# Patient Record
Sex: Female | Born: 1965 | Race: White | Hispanic: No | Marital: Married | Smoking: Never smoker
Health system: Southern US, Community
[De-identification: ages and names within clinical notes are randomized; demographics above are authoritative.]

## PROBLEM LIST (undated history)

## (undated) DIAGNOSIS — N289 Disorder of kidney and ureter, unspecified: Secondary | ICD-10-CM

## (undated) DIAGNOSIS — R112 Nausea with vomiting, unspecified: Secondary | ICD-10-CM

## (undated) DIAGNOSIS — K5790 Diverticulosis of intestine, part unspecified, without perforation or abscess without bleeding: Secondary | ICD-10-CM

## (undated) DIAGNOSIS — C73 Malignant neoplasm of thyroid gland: Secondary | ICD-10-CM

## (undated) DIAGNOSIS — R7303 Prediabetes: Secondary | ICD-10-CM

## (undated) DIAGNOSIS — Z9889 Other specified postprocedural states: Secondary | ICD-10-CM

## (undated) DIAGNOSIS — IMO0002 Reserved for concepts with insufficient information to code with codable children: Secondary | ICD-10-CM

## (undated) HISTORY — PX: LAMINECTOMY: SHX219

## (undated) HISTORY — PX: KNEE ARTHROSCOPY: SUR90

---

## 2001-08-09 ENCOUNTER — Other Ambulatory Visit: Admission: RE | Admit: 2001-08-09 | Discharge: 2001-08-09 | Payer: Self-pay | Admitting: Family Medicine

## 2007-06-07 ENCOUNTER — Encounter: Admission: RE | Admit: 2007-06-07 | Discharge: 2007-06-07 | Payer: Self-pay | Admitting: Family Medicine

## 2009-06-26 ENCOUNTER — Encounter: Admission: RE | Admit: 2009-06-26 | Discharge: 2009-06-26 | Payer: Self-pay | Admitting: Family Medicine

## 2010-09-29 ENCOUNTER — Encounter: Payer: Self-pay | Admitting: Family Medicine

## 2011-08-23 ENCOUNTER — Emergency Department (INDEPENDENT_AMBULATORY_CARE_PROVIDER_SITE_OTHER)
Admission: EM | Admit: 2011-08-23 | Discharge: 2011-08-23 | Disposition: A | Discharge status: Home / Self Care | Payer: BC Managed Care – PPO | Source: Home / Self Care | Attending: Family Medicine | Admitting: Family Medicine

## 2011-08-23 ENCOUNTER — Emergency Department (INDEPENDENT_AMBULATORY_CARE_PROVIDER_SITE_OTHER): Payer: BC Managed Care – PPO

## 2011-08-23 DIAGNOSIS — Z8719 Personal history of other diseases of the digestive system: Secondary | ICD-10-CM

## 2011-08-23 DIAGNOSIS — R1032 Left lower quadrant pain: Secondary | ICD-10-CM

## 2011-08-23 DIAGNOSIS — K5732 Diverticulitis of large intestine without perforation or abscess without bleeding: Secondary | ICD-10-CM

## 2011-08-23 HISTORY — DX: Disorder of kidney and ureter, unspecified: N28.9

## 2011-08-23 HISTORY — DX: Reserved for concepts with insufficient information to code with codable children: IMO0002

## 2011-08-23 LAB — POCT URINALYSIS DIP (DEVICE)
Bilirubin Urine: NEGATIVE
Hgb urine dipstick: NEGATIVE
Ketones, ur: NEGATIVE mg/dL
Leukocytes, UA: NEGATIVE
Protein, ur: NEGATIVE mg/dL
Specific Gravity, Urine: 1.02 (ref 1.005–1.030)
Urobilinogen, UA: 1 mg/dL (ref 0.0–1.0)
pH: 7 (ref 5.0–8.0)

## 2011-08-23 LAB — DIFFERENTIAL
Basophils Absolute: 0 10*3/uL (ref 0.0–0.1)
Basophils Relative: 0 % (ref 0–1)
Lymphocytes Relative: 13 % (ref 12–46)
Monocytes Relative: 10 % (ref 3–12)
Neutro Abs: 9.2 10*3/uL — ABNORMAL HIGH (ref 1.7–7.7)
Neutrophils Relative %: 76 % (ref 43–77)

## 2011-08-23 LAB — CBC
MCV: 87.1 fL (ref 78.0–100.0)
Platelets: 230 10*3/uL (ref 150–400)
RBC: 4.42 MIL/uL (ref 3.87–5.11)

## 2011-08-23 LAB — OCCULT BLOOD, POC DEVICE: Fecal Occult Bld: NEGATIVE

## 2011-08-23 LAB — BASIC METABOLIC PANEL
CO2: 24 mEq/L (ref 19–32)
Creatinine, Ser: 0.69 mg/dL (ref 0.50–1.10)
Potassium: 3.6 mEq/L (ref 3.5–5.1)

## 2011-08-23 MED ORDER — TRAMADOL HCL 50 MG PO TABS: 50.0000 mg | ORAL_TABLET | Freq: Four times a day (QID) | ORAL | Status: AC | PRN

## 2011-08-23 MED ORDER — AMOXICILLIN-POT CLAVULANATE 875-125 MG PO TABS: 1.0000 | ORAL_TABLET | Freq: Two times a day (BID) | ORAL | Status: AC

## 2011-08-23 MED ORDER — METRONIDAZOLE 500 MG PO TABS: 500.0000 mg | ORAL_TABLET | Freq: Two times a day (BID) | ORAL | Status: AC

## 2011-08-23 MED ORDER — OMEPRAZOLE-SODIUM BICARBONATE 40-1100 MG PO CAPS: 1.0000 | ORAL_CAPSULE | Freq: Every day | ORAL | Status: AC

## 2011-08-23 NOTE — ED Notes (Signed)
Pt states she has LLQ pain since Monday.  Pain is severe, low grade fever and denies diarrhea and vomiting

## 2011-08-23 NOTE — ED Provider Notes (Signed)
History    Patient reports having abdominal pain started on Monday. She reports feeling like she can't evacuate her bowels completely. She reports pain left lower quadrant some nausea and some increased GERD but no diarrhea. While the pain has been in the left lower quadrant she denies any vaginal discharge of significance and states other than a slightly increased mucus no problems with dyspareunia. She's never had diverticulitis but this does research and fit and is curious whether her symptoms indicate that she had diverticulitis in the. She reports having some fever and myalgia on Wednesday but this resolved. And did not feel like the flu. CSN: [id number] Arrival date & time: 08/23/2011  2:10 PM   First MD Initiated Contact with Patient 08/23/11 1259      Chief Complaint  Patient presents with  . Abdominal Pain    (Consider location/radiation/quality/duration/timing/severity/associated sxs/prior treatment) HPI  Past Medical History  Diagnosis Date  . Non-functioning kidney   . Degenerative disk disease     Past Surgical History  Procedure Date  . Knee arthroscopy   . Cesarean section     History reviewed. No pertinent family history.  History  Substance Use Topics  . Smoking status: Never Smoker   . Smokeless tobacco: Not on file  . Alcohol Use: Yes     occ    OB History    Grav Para Term Preterm Abortions TAB SAB Ect Mult Living                  Review of Systems  Constitutional: Positive for fever. Negative for appetite change.  Gastrointestinal: Positive for abdominal pain, constipation and abdominal distention. Negative for nausea, vomiting, blood in stool and rectal pain.  Genitourinary: Negative for urgency, vaginal discharge, difficulty urinating and dyspareunia.    Allergies  Review of patient's allergies indicates no known allergies.  Home Medications  No current outpatient prescriptions on file.  BP 126/86  Pulse 81  Temp(Src) 99.8 F (37.7 C)  (Oral)  Resp 16  SpO2 100%  LMP 08/02/2011  Physical Exam  Constitutional: She appears well-developed.  HENT:  Head: Normocephalic.  Neck: Normal range of motion.  Cardiovascular: Normal rate, regular rhythm and normal heart sounds.   Pulmonary/Chest: Effort normal and breath sounds normal. No respiratory distress. She has no wheezes.  Abdominal: Soft. There is no hepatosplenomegaly. There is tenderness in the left lower quadrant. There is no rebound, no guarding and no CVA tenderness.    Genitourinary: Rectal exam shows tenderness. Rectal exam shows no internal hemorrhoid, no fissure and anal tone normal. Guaiac negative stool.   some tenderness noted on rectal exam in the left lower quadrant area  ED Course  Procedures (including critical care time)  Labs Reviewed  BASIC METABOLIC PANEL - Abnormal; Notable for the following:    Calcium 12.4 (*)    All other components within normal limits  CBC - Abnormal; Notable for the following:    WBC 12.1 (*)    All other components within normal limits  DIFFERENTIAL - Abnormal; Notable for the following:    Neutro Abs 9.2 (*)    Monocytes Absolute 1.1 (*)    All other components within normal limits  POCT URINALYSIS DIP (DEVICE)  LIPASE, BLOOD  POCT URINALYSIS DIPSTICK  POCT OCCULT BLOOD STOOL, DEVICE  GC/CHLAMYDIA PROBE AMP, URINE   Dg Abd 2 Views  08/23/2011  *RADIOLOGY REPORT*  Clinical Data: Left lower quadrant abdominal pain.  ABDOMEN - 2 VIEW  Comparison: None  Findings: The lung bases are clear.  There is scattered air and stool in the colon.  Air filled loops of small bowel are noted without significant distention or air fluid levels.  Findings could be secondary to a mild, developing ileus or gastroenteritis.  No findings for obstruction or perforation.  The soft tissue shadows are maintained.  The bony structures are intact.  IMPRESSION: Probable mild ileus or gastroenteritis.  No findings for obstruction or perforation.   Original Report Authenticated By: P. Loralie Champagne, M.D.     No diagnosis found.    MDM          Hassan Rowan, MD 08/23/11 2128

## 2011-08-25 LAB — GC/CHLAMYDIA PROBE AMP, URINE: GC Probe Amp, Urine: NEGATIVE

## 2012-05-25 ENCOUNTER — Emergency Department (INDEPENDENT_AMBULATORY_CARE_PROVIDER_SITE_OTHER): Payer: BC Managed Care – PPO

## 2012-05-25 ENCOUNTER — Encounter (HOSPITAL_COMMUNITY): Payer: Self-pay | Admitting: *Deleted

## 2012-05-25 ENCOUNTER — Emergency Department (HOSPITAL_COMMUNITY)
Admission: EM | Admit: 2012-05-25 | Discharge: 2012-05-25 | Disposition: A | Discharge status: Home / Self Care | Payer: BC Managed Care – PPO | Source: Home / Self Care | Attending: Emergency Medicine | Admitting: Emergency Medicine

## 2012-05-25 DIAGNOSIS — K5732 Diverticulitis of large intestine without perforation or abscess without bleeding: Secondary | ICD-10-CM

## 2012-05-25 DIAGNOSIS — K5792 Diverticulitis of intestine, part unspecified, without perforation or abscess without bleeding: Secondary | ICD-10-CM

## 2012-05-25 HISTORY — DX: Diverticulosis of intestine, part unspecified, without perforation or abscess without bleeding: K57.90

## 2012-05-25 LAB — WET PREP, GENITAL: Yeast Wet Prep HPF POC: NONE SEEN

## 2012-05-25 LAB — POCT URINALYSIS DIP (DEVICE)
Bilirubin Urine: NEGATIVE
Glucose, UA: 100 mg/dL — AB
Hgb urine dipstick: NEGATIVE
Leukocytes, UA: NEGATIVE
Nitrite: NEGATIVE
Protein, ur: NEGATIVE mg/dL
Urobilinogen, UA: 2 mg/dL — ABNORMAL HIGH (ref 0.0–1.0)

## 2012-05-25 LAB — POCT I-STAT, CHEM 8
BUN: 9 mg/dL (ref 6–23)
HCT: 42 % (ref 36.0–46.0)
Hemoglobin: 14.3 g/dL (ref 12.0–15.0)
Sodium: 139 mEq/L (ref 135–145)
TCO2: 26 mmol/L (ref 0–100)

## 2012-05-25 LAB — OCCULT BLOOD, POC DEVICE: Fecal Occult Bld: NEGATIVE

## 2012-05-25 MED ORDER — METRONIDAZOLE 500 MG PO TABS: 500.0000 mg | ORAL_TABLET | Freq: Three times a day (TID) | ORAL | Status: DC

## 2012-05-25 MED ORDER — CIPROFLOXACIN HCL 500 MG PO TABS: 500.0000 mg | ORAL_TABLET | Freq: Two times a day (BID) | ORAL | Status: DC

## 2012-05-25 MED ORDER — IBUPROFEN 800 MG PO TABS: 800.0000 mg | ORAL_TABLET | Freq: Three times a day (TID) | ORAL | Status: DC | PRN

## 2012-05-25 NOTE — ED Provider Notes (Signed)
History     CSN: [id number]  Arrival date & time 05/25/12  1036   First MD Initiated Contact with Patient 05/25/12 1046      Chief Complaint  Patient presents with  . Abdominal Pain    (Consider location/radiation/quality/duration/timing/severity/associated sxs/prior treatment) HPI Comments: Patient reports acute onset of constant, waxing and waning nonradiating left lower quadrant pain starting last night. Reports that the pain feels like it is "twisting". It is worse with getting up, walking around. No alleviating factors. Tried some Aleve without relief. It is not related to eating, fasting, defecation, urination. Had normal bowel movement this morning, no change in pain. Had sexual intercourse with husband of 22 years on Monday, reports no pain with this. No vaginal bleeding or discharge.  no hematuria, urgency, frequency, cloudy or oderous urine.  Reports feeling feverish last night, but no documented temperature at home took some Aleve last night with no improvement her symptoms. Has not had any antipyretics the past 8 hour. States that her symptoms feel similar to previous episode of diverticulitis for which she was evaluated in the urgent care 10 months ago. No history of gonorrhea, Chlamydia, PID, herpes, HIV, syphilis. No history of ovarian cysts, nephrolithiasis. Patient states that she has a "nonfunctioning kidney".   ROS as noted in HPI. All other ROS negative.   Patient is a 46 y.o. female presenting with abdominal pain. The history is provided by the patient. No language interpreter was used.  Abdominal Pain The primary symptoms of the illness include abdominal pain. The current episode started yesterday. The onset of the illness was sudden. The problem has not changed since onset. The abdominal pain is located in the LLQ. The abdominal pain does not radiate. The abdominal pain is relieved by nothing. The abdominal pain is exacerbated by movement.  The patient states that she  believes she is currently not pregnant. The patient has not had a change in bowel habit. Symptoms associated with the illness do not include chills, anorexia, diaphoresis, heartburn, constipation, urgency, hematuria, frequency or back pain. Significant associated medical issues include diverticulitis.    Past Medical History  Diagnosis Date  . Non-functioning kidney   . Diverticulosis   . Degenerative disk disease     C4-C7    Past Surgical History  Procedure Date  . Knee arthroscopy   . Cesarean section     Family History  Problem Relation Age of Onset  . Hypertension Mother   . Heart failure Father   . Cancer Father     History  Substance Use Topics  . Smoking status: Never Smoker   . Smokeless tobacco: Not on file  . Alcohol Use: Yes     occ    OB History    Grav Para Term Preterm Abortions TAB SAB Ect Mult Living                  Review of Systems  Constitutional: Negative for chills and diaphoresis.  Gastrointestinal: Positive for abdominal pain. Negative for heartburn, constipation and anorexia.  Genitourinary: Negative for urgency, frequency and hematuria.  Musculoskeletal: Negative for back pain.    Allergies  Review of patient's allergies indicates no known allergies.  Home Medications   Current Outpatient Rx  Name Route Sig Dispense Refill  . CIPROFLOXACIN HCL 500 MG PO TABS Oral Take 1 tablet (500 mg total) by mouth 2 (two) times daily. X  10 days 20 tablet 0  . IBUPROFEN 800 MG PO TABS Oral Take 1  tablet (800 mg total) by mouth every 8 (eight) hours as needed for pain. 20 tablet 0  . METRONIDAZOLE 500 MG PO TABS Oral Take 1 tablet (500 mg total) by mouth 3 (three) times daily. X 10 days 30 tablet 0    BP 118/68  Pulse 68  Temp 98 F (36.7 C) (Oral)  Resp 18  SpO2 99%  LMP 05/09/2012  Physical Exam  Nursing note and vitals reviewed. Constitutional: She is oriented to person, place, and time. She appears well-developed and well-nourished.    HENT:  Head: Normocephalic and atraumatic.  Eyes: Conjunctivae normal and EOM are normal.  Neck: Normal range of motion.  Cardiovascular: Normal rate, regular rhythm, normal heart sounds and intact distal pulses.   No murmur heard. Pulmonary/Chest: Effort normal and breath sounds normal.  Abdominal: Soft. Normal appearance and bowel sounds are normal. She exhibits no distension. There is tenderness in the left lower quadrant. There is no rebound, no guarding and no CVA tenderness.  Genitourinary: Rectum normal, vagina normal and uterus normal. Rectal exam shows no fissure, no mass, no tenderness and anal tone normal. Guaiac negative stool. Pelvic exam was performed with patient supine. Cervix exhibits no motion tenderness and no discharge. Right adnexum displays no mass, no tenderness and no fullness. Left adnexum displays no mass, no tenderness and no fullness.       Soft stool rectal vault  Musculoskeletal: Normal range of motion.  Neurological: She is alert and oriented to person, place, and time.  Skin: Skin is warm and dry.  Psychiatric: She has a normal mood and affect. Her behavior is normal. Judgment and thought content normal.    ED Course  Procedures (including critical care time)  Labs Reviewed  POCT URINALYSIS DIP (DEVICE) - Abnormal; Notable for the following:    Glucose, UA 100 (*)     Urobilinogen, UA 2.0 (*)     All other components within normal limits  POCT I-STAT, CHEM 8 - Abnormal; Notable for the following:    Glucose, Bld 108 (*)     Calcium, Ion 1.65 (*)     All other components within normal limits  POCT PREGNANCY, URINE  OCCULT BLOOD, POC DEVICE  OCCULT BLOOD X 1 CARD TO LAB, STOOL  GC/CHLAMYDIA PROBE AMP, GENITAL  WET PREP, GENITAL     1. Diverticulitis   2. Serum calcium elevated     Results for orders placed during the hospital encounter of 05/25/12  POCT URINALYSIS DIP (DEVICE)      Component Value Range   Glucose, UA 100 (*) NEGATIVE mg/dL    Bilirubin Urine NEGATIVE  NEGATIVE   Ketones, ur NEGATIVE  NEGATIVE mg/dL   Specific Gravity, Urine 1.020  1.005 - 1.030   Hgb urine dipstick NEGATIVE  NEGATIVE   pH 6.5  5.0 - 8.0   Protein, ur NEGATIVE  NEGATIVE mg/dL   Urobilinogen, UA 2.0 (*) 0.0 - 1.0 mg/dL   Nitrite NEGATIVE  NEGATIVE   Leukocytes, UA NEGATIVE  NEGATIVE  POCT PREGNANCY, URINE      Component Value Range   Preg Test, Ur NEGATIVE  NEGATIVE  OCCULT BLOOD, POC DEVICE      Component Value Range   Fecal Occult Bld NEGATIVE    POCT I-STAT, CHEM 8      Component Value Range   Sodium 139  135 - 145 mEq/L   Potassium 3.5  3.5 - 5.1 mEq/L   Chloride 105  96 - 112 mEq/L   BUN  9  6 - 23 mg/dL   Creatinine, Ser 1.61  0.50 - 1.10 mg/dL   Glucose, Bld 096 (*) 70 - 99 mg/dL   Calcium, Ion 0.45 (*) 1.12 - 1.23 mmol/L   TCO2 26  0 - 100 mmol/L   Hemoglobin 14.3  12.0 - 15.0 g/dL   HCT 40.9  81.1 - 91.4 %   Comment NOTIFIED PHYSICIAN     Dg Abd Acute W/chest  05/25/2012  *RADIOLOGY REPORT*  Clinical Data: Left lower quadrant pain.  ACUTE ABDOMEN SERIES (ABDOMEN 2 VIEW & CHEST 1 VIEW)  Comparison: 08/23/2011  Findings: Lungs are clear.  No free intraperitoneal air. Small hiatal hernia.  The bowel gas pattern is non-obstructive. Organ outlines are normal where seen. No acute or aggressive osseous abnormality identified.  IMPRESSION: Small hiatal hernia.  Nonobstructive bowel gas pattern.   Original Report Authenticated By: Waneta Martins, M.D.    MDM  Previous records reviewed. Patient seen for similar encounter December 2012, slightly elevated white count, calcium level at 1.23, 2 view abdomen showed mild ileus versus gastroenteritis. Otherwise workup was normal.   X-ray reviewed. No perforation, obstruction. Full report per radiology. Mild hypercalcemia on istat  Pt abd exam is benign, no peritoneal signs. Afebrile, vitals normal. No evidence of surgical abd. Doubt SBO, mesenteric ischemia, appendicitis, hepatitis,  cholecystitis, pancreatitis, or perforated viscus. No evidence to support or suggest GYN pathology such as ovarian torsion or infection. Will send home with ibuprofen, Cipro and Flagyl for outpatient treatment of diverticulitis. Discussed labs, MDM and plan with patient. She will followup with her primary care physician for workup of hypercalcemia. Gave patient strict  abdominal pain return precautions. Advised her to return here in 24 hours repeat abdominal exam. Patient agrees with plan.     Luiz Blare, MD 05/25/12 1351

## 2012-05-25 NOTE — ED Notes (Signed)
Pt reports left side abdomen pain that started yesterday - DENIES  N/v/d, urinary frequency or pain. Pt states "It feels like when I had diverticulitis 10 months ago - a twisting pain."

## 2012-05-26 LAB — GC/CHLAMYDIA PROBE AMP, GENITAL: Chlamydia, DNA Probe: NEGATIVE

## 2012-07-27 ENCOUNTER — Ambulatory Visit: Payer: Self-pay | Admitting: Otolaryngology

## 2012-08-25 ENCOUNTER — Ambulatory Visit: Payer: Self-pay | Admitting: Otolaryngology

## 2012-08-26 LAB — CALCIUM: Calcium, Total: 8.7 mg/dL (ref 8.5–10.1)

## 2012-10-20 ENCOUNTER — Ambulatory Visit: Payer: Self-pay | Admitting: Otolaryngology

## 2012-10-20 HISTORY — PX: TOTAL THYROIDECTOMY: SHX2547

## 2012-10-20 LAB — CALCIUM: Calcium, Total: 7.9 mg/dL — ABNORMAL LOW (ref 8.5–10.1)

## 2012-10-21 LAB — ALBUMIN: Albumin: 3.4 g/dL (ref 3.4–5.0)

## 2012-10-21 LAB — CALCIUM: Calcium, Total: 7.4 mg/dL — ABNORMAL LOW (ref 8.5–10.1)

## 2012-10-25 LAB — PATHOLOGY REPORT

## 2013-01-28 ENCOUNTER — Emergency Department (HOSPITAL_COMMUNITY)
Admission: EM | Admit: 2013-01-28 | Discharge: 2013-01-29 | Disposition: A | Discharge status: Home / Self Care | Payer: BC Managed Care – PPO | Attending: Emergency Medicine | Admitting: Emergency Medicine

## 2013-01-28 ENCOUNTER — Encounter (HOSPITAL_COMMUNITY): Payer: Self-pay | Admitting: *Deleted

## 2013-01-28 ENCOUNTER — Emergency Department (HOSPITAL_COMMUNITY): Payer: BC Managed Care – PPO

## 2013-01-28 DIAGNOSIS — K5732 Diverticulitis of large intestine without perforation or abscess without bleeding: Secondary | ICD-10-CM | POA: Insufficient documentation

## 2013-01-28 DIAGNOSIS — Z3202 Encounter for pregnancy test, result negative: Secondary | ICD-10-CM | POA: Insufficient documentation

## 2013-01-28 DIAGNOSIS — Z8739 Personal history of other diseases of the musculoskeletal system and connective tissue: Secondary | ICD-10-CM | POA: Insufficient documentation

## 2013-01-28 DIAGNOSIS — K5792 Diverticulitis of intestine, part unspecified, without perforation or abscess without bleeding: Secondary | ICD-10-CM

## 2013-01-28 DIAGNOSIS — Z87448 Personal history of other diseases of urinary system: Secondary | ICD-10-CM | POA: Insufficient documentation

## 2013-01-28 DIAGNOSIS — R509 Fever, unspecified: Secondary | ICD-10-CM | POA: Insufficient documentation

## 2013-01-28 DIAGNOSIS — Z85850 Personal history of malignant neoplasm of thyroid: Secondary | ICD-10-CM | POA: Insufficient documentation

## 2013-01-28 DIAGNOSIS — Z9889 Other specified postprocedural states: Secondary | ICD-10-CM | POA: Insufficient documentation

## 2013-01-28 DIAGNOSIS — Z79899 Other long term (current) drug therapy: Secondary | ICD-10-CM | POA: Insufficient documentation

## 2013-01-28 HISTORY — DX: Malignant neoplasm of thyroid gland: C73

## 2013-01-28 LAB — CBC WITH DIFFERENTIAL/PLATELET
Basophils Absolute: 0 10*3/uL (ref 0.0–0.1)
Basophils Relative: 0 % (ref 0–1)
Eosinophils Absolute: 0.2 10*3/uL (ref 0.0–0.7)
Eosinophils Relative: 2 % (ref 0–5)
HCT: 39 % (ref 36.0–46.0)
Hemoglobin: 13.3 g/dL (ref 12.0–15.0)
Lymphocytes Relative: 9 % — ABNORMAL LOW (ref 12–46)
Lymphs Abs: 1.3 10*3/uL (ref 0.7–4.0)
MCH: 29.4 pg (ref 26.0–34.0)
MCHC: 34.1 g/dL (ref 30.0–36.0)
MCV: 86.3 fL (ref 78.0–100.0)
Monocytes Absolute: 1.2 10*3/uL — ABNORMAL HIGH (ref 0.1–1.0)
Monocytes Relative: 9 % (ref 3–12)
Neutro Abs: 11 10*3/uL — ABNORMAL HIGH (ref 1.7–7.7)
Neutrophils Relative %: 80 % — ABNORMAL HIGH (ref 43–77)
Platelets: 257 10*3/uL (ref 150–400)
RBC: 4.52 MIL/uL (ref 3.87–5.11)
RDW: 12.2 % (ref 11.5–15.5)
WBC: 13.7 10*3/uL — ABNORMAL HIGH (ref 4.0–10.5)

## 2013-01-28 LAB — COMPREHENSIVE METABOLIC PANEL
ALT: 30 U/L (ref 0–35)
AST: 38 U/L — ABNORMAL HIGH (ref 0–37)
Albumin: 4.1 g/dL (ref 3.5–5.2)
Alkaline Phosphatase: 69 U/L (ref 39–117)
BUN: 11 mg/dL (ref 6–23)
CO2: 25 mEq/L (ref 19–32)
Calcium: 9.4 mg/dL (ref 8.4–10.5)
Chloride: 102 mEq/L (ref 96–112)
Creatinine, Ser: 0.76 mg/dL (ref 0.50–1.10)
GFR calc Af Amer: >90 mL/min (ref >90)
GFR calc non Af Amer: >90 mL/min (ref >90)
Glucose, Bld: 106 mg/dL — ABNORMAL HIGH (ref 70–99)
Potassium: 3.8 mEq/L (ref 3.5–5.1)
Sodium: 138 mEq/L (ref 135–145)
Total Bilirubin: 0.3 mg/dL (ref 0.3–1.2)
Total Protein: 7.6 g/dL (ref 6.0–8.3)

## 2013-01-28 LAB — URINALYSIS, ROUTINE W REFLEX MICROSCOPIC
Bilirubin Urine: NEGATIVE
Glucose, UA: NEGATIVE mg/dL
Hgb urine dipstick: NEGATIVE
Ketones, ur: NEGATIVE mg/dL
Leukocytes, UA: NEGATIVE
Nitrite: NEGATIVE
Protein, ur: NEGATIVE mg/dL
Specific Gravity, Urine: 1.012 (ref 1.005–1.030)
Urobilinogen, UA: 1 mg/dL (ref 0.0–1.0)
pH: 6.5 (ref 5.0–8.0)

## 2013-01-28 LAB — GLUCOSE, CAPILLARY: Glucose-Capillary: 109 mg/dL — ABNORMAL HIGH (ref 70–99)

## 2013-01-28 LAB — PREGNANCY, URINE: Preg Test, Ur: NEGATIVE

## 2013-01-28 MED ORDER — METRONIDAZOLE IN NACL 5-0.79 MG/ML-% IV SOLN
500.0000 mg | Freq: Once | INTRAVENOUS | Status: AC
  Administered 2013-01-29: 500 mg via INTRAVENOUS
  Filled 2013-01-28: qty 100

## 2013-01-28 MED ORDER — IOHEXOL 300 MG/ML  SOLN
100.0000 mL | Freq: Once | INTRAMUSCULAR | Status: AC | PRN
  Administered 2013-01-28: 100 mL via INTRAVENOUS

## 2013-01-28 MED ORDER — MORPHINE SULFATE 4 MG/ML IJ SOLN: 6.0000 mg | Freq: Once | INTRAMUSCULAR | Status: DC

## 2013-01-28 MED ORDER — CIPROFLOXACIN IN D5W 400 MG/200ML IV SOLN
400.0000 mg | Freq: Once | INTRAVENOUS | Status: AC
  Administered 2013-01-29: 400 mg via INTRAVENOUS
  Filled 2013-01-28: qty 200

## 2013-01-28 MED ORDER — IOHEXOL 300 MG/ML  SOLN
50.0000 mL | Freq: Once | INTRAMUSCULAR | Status: AC | PRN
  Administered 2013-01-28: 50 mL via ORAL

## 2013-01-28 MED ORDER — SODIUM CHLORIDE 0.9 % IV BOLUS (SEPSIS)
1000.0000 mL | Freq: Once | INTRAVENOUS | Status: AC
  Administered 2013-01-28: 1000 mL via INTRAVENOUS

## 2013-01-28 MED ORDER — ONDANSETRON HCL 4 MG/2ML IJ SOLN: 4.0000 mg | Freq: Once | INTRAMUSCULAR | Status: DC

## 2013-01-28 NOTE — ED Provider Notes (Signed)
History     CSN: [id number]  Arrival date & time 01/28/13  2045   First MD Initiated Contact with Patient 01/28/13 2111      Chief Complaint  Patient presents with  . Abdominal Pain  . Fever    (Consider location/radiation/quality/duration/timing/severity/associated sxs/prior treatment) HPI Patient presents to the emergency department with left lower quadrant abdominal pain, that began earlier today.  Patient, states she was seen at an urgent care and was referred here for further evaluation of her pain.  Patient denies nausea, vomiting, diarrhea, blood in her stool, vaginal bleeding, vaginal discharge, chest pain, shortness of breath, fever, dizziness, syncope, rash or weakness.  Patient, states, that she has a history of diverticulitis, but no CT scan in the past.  Patient, states, that she was placed on antibiotics.  Back in December for suspected diverticulitis.  Patient, states she did not take any medications prior to arrival.  Patient, states palpation makes her pain, worse Past Medical History  Diagnosis Date  . Non-functioning kidney   . Diverticulosis   . Degenerative disk disease     C4-C7  . Thyroid cancer     February 2014    Past Surgical History  Procedure Laterality Date  . Knee arthroscopy    . Cesarean section      Family History  Problem Relation Age of Onset  . Hypertension Mother   . Heart failure Father   . Cancer Father     History  Substance Use Topics  . Smoking status: Never Smoker   . Smokeless tobacco: Not on file  . Alcohol Use: Yes     Comment: occ    OB History   Grav Para Term Preterm Abortions TAB SAB Ect Mult Living                  Review of Systems All other systems negative except as documented in the HPI. All pertinent positives and negatives as reviewed in the HPI. Allergies  Review of patient's allergies indicates no known allergies.  Home Medications   Current Outpatient Rx  Name  Route  Sig  Dispense  Refill  .  levothyroxine (SYNTHROID, LEVOTHROID) 137 MCG tablet   Oral   Take 137 mcg by mouth daily before breakfast.         . naproxen sodium (ALEVE) 220 MG tablet   Oral   Take 220 mg by mouth 2 (two) times daily with a meal. pain           BP 126/73  Pulse 98  Temp(Src) 99.8 F (37.7 C) (Oral)  Resp 16  Ht 5\' 3"  (1.6 m)  Wt 149 lb (67.586 kg)  BMI 26.4 kg/m2  SpO2 100%  LMP 12/30/2012  Physical Exam  Nursing note and vitals reviewed. Constitutional: She is oriented to person, place, and time. She appears well-developed and well-nourished. No distress.  HENT:  Head: Normocephalic and atraumatic.  Mouth/Throat: Oropharynx is clear and moist.  Eyes: Pupils are equal, round, and reactive to light.  Neck: Normal range of motion. Neck supple.  Cardiovascular: Normal rate, regular rhythm and normal heart sounds.  Exam reveals no gallop and no friction rub.   No murmur heard. Pulmonary/Chest: Effort normal and breath sounds normal. No respiratory distress.  Abdominal: Soft. Normal appearance and bowel sounds are normal. She exhibits no distension. There is no hepatosplenomegaly. There is tenderness in the left lower quadrant. There is guarding. There is no rigidity, no rebound and no CVA tenderness. No  hernia.    Neurological: She is alert and oriented to person, place, and time. She exhibits normal muscle tone. Coordination normal.  Skin: Skin is warm and dry. No rash noted.    ED Course  Procedures (including critical care time)  Labs Reviewed  CBC WITH DIFFERENTIAL  COMPREHENSIVE METABOLIC PANEL  URINALYSIS, ROUTINE W REFLEX MICROSCOPIC  PREGNANCY, URINE   Patient is advised she will receive lab testing, IV fluids and a CT scan.  Patient was offered pain medication, but declines at this time.  Patient's questions were answered.  Patient is advised of the process while here in the emergency department  12:15 AM.  Patient is reassessed and given a plan.  Patient did not want  to be admitted and would like to go home on oral medications.  She is advised that she could get worse and, that she'll need to return here for any worsening in her condition.  Patient is advised she'll need close followup with her primary care Dr. patient also informed the nurse that she did not want complete her IV antibiotics.  She is advised that these are important but still refused.  Patient be given by mouth medications and advised return to the ER with any worsening in her condition. MDM  MDM Reviewed: vitals and nursing note Interpretation: labs and CT scan            Carlyle Dolly, PA-C 01/29/13 0054  Medical screening examination/treatment/procedure(s) were conducted as a shared visit with non-physician practitioner(s) and myself.  I personally evaluated the patient during the encounter  Pt comes in with cc of LLQ pain. No fevers, pt is relatively quite healthy and no nausea, emesis. Pt has severe pain, but still would prefer outpatient management - which i think is appropriate thinking. Return precautions discussed.  Derwood Kaplan, MD 01/30/13 [phone]

## 2013-01-28 NOTE — ED Notes (Signed)
Pt states she has fever and left lower abdominal pain  And some constipation,  Pt is alert and oriented 10/10

## 2013-01-29 MED ORDER — DIPHENHYDRAMINE HCL 50 MG/ML IJ SOLN
12.5000 mg | Freq: Once | INTRAMUSCULAR | Status: AC
  Administered 2013-01-29: 12.5 mg via INTRAVENOUS
  Filled 2013-01-29: qty 1

## 2013-01-29 MED ORDER — PROMETHAZINE HCL 25 MG PO TABS: 25.0000 mg | ORAL_TABLET | Freq: Four times a day (QID) | ORAL | Status: DC | PRN

## 2013-01-29 MED ORDER — TRAMADOL HCL 50 MG PO TABS: 50.0000 mg | ORAL_TABLET | Freq: Four times a day (QID) | ORAL | Status: DC | PRN

## 2013-01-29 MED ORDER — CIPROFLOXACIN HCL 500 MG PO TABS: 500.0000 mg | ORAL_TABLET | Freq: Two times a day (BID) | ORAL | Status: DC

## 2013-01-29 MED ORDER — METRONIDAZOLE 500 MG PO TABS: 500.0000 mg | ORAL_TABLET | Freq: Two times a day (BID) | ORAL | Status: DC

## 2013-01-29 NOTE — ED Notes (Signed)
Pt refuses to continue with IV antibotics.   Pt reports "its too much going through my veins"  Pt reports discomfort to IV site.  Pt asked to change IV site.  Pt refuses another IV.  Pt reports "I want to take oral antibotic"

## 2013-01-29 NOTE — ED Notes (Signed)
Pt verbalizes understanding 

## 2013-03-15 ENCOUNTER — Other Ambulatory Visit (HOSPITAL_COMMUNITY): Payer: Self-pay | Admitting: Orthopedic Surgery

## 2013-03-15 DIAGNOSIS — C73 Malignant neoplasm of thyroid gland: Secondary | ICD-10-CM

## 2013-03-24 ENCOUNTER — Encounter (HOSPITAL_COMMUNITY)
Admission: RE | Admit: 2013-03-24 | Discharge: 2013-03-24 | Disposition: A | Discharge status: Home / Self Care | Payer: BC Managed Care – PPO | Source: Ambulatory Visit | Attending: Orthopedic Surgery | Admitting: Orthopedic Surgery

## 2013-03-24 ENCOUNTER — Encounter (HOSPITAL_COMMUNITY): Payer: Self-pay

## 2013-03-24 DIAGNOSIS — C73 Malignant neoplasm of thyroid gland: Secondary | ICD-10-CM | POA: Insufficient documentation

## 2013-03-24 MED ORDER — TECHNETIUM TC 99M MEDRONATE IV KIT
24.8000 | PACK | Freq: Once | INTRAVENOUS | Status: AC | PRN
  Administered 2013-03-24: 24.8 via INTRAVENOUS

## 2013-07-11 LAB — HM MAMMOGRAPHY

## 2013-10-13 LAB — HM PAP SMEAR: HM Pap smear: NEGATIVE

## 2014-12-26 NOTE — Op Note (Signed)
PATIENT NAME:  Sierra Perkins, Sierra Perkins MR#:  [id number] DATE OF BIRTH:  [birth year]  DATE OF PROCEDURE:  08/25/2012  PREOPERATIVE DIAGNOSES:  1.  Left parathyroid adenoma with hypercalcemia.  2.  Right thyroid nodule.   POSTOPERATIVE DIAGNOSES: 1.  Left parathyroid adenoma with hypercalcemia.  2.  Right thyroid nodule.   PROCEDURE: 1.  Left parathyroidectomy.  2.  Right subtotal thyroidectomy.   SURGEON: Malon Kindle, MD.  FIRST ASSISTANT: Mahlon Gammon, M.D.   ANESTHESIA: General endotracheal.   INDICATIONS: The patient with a history of hypercalcemia with note of a mass adjacent to the inferior pole of the left thyroid gland consistent with a parathyroid adenoma. She was also found to have a nodule in the right superior pole of the thyroid gland.   FINDINGS: The parathyroid adenoma was easily located in the expected location along the inferior pole left thyroid gland and was approximately 1.5 cm in size. There was approximately a 1 cm nodule in the superior pole of the right thyroid gland, which was somewhat soft and a little difficult to palpate. There was a firm area immediately superomedially, which was removed, but there was also another nodular area superolaterally, which was removed separately in both sent for pathology retaining the inferior one third of the right lobe of thyroid gland.   COMPLICATIONS: None.   DESCRIPTION OF PROCEDURE: After informed consent, the patient the operating room and placed in the supine position. After induction of general endotracheal anesthesia with placement of the laryngeal nerve monitor under direct visualization, the patient was prepped and draped in the usual sterile fashion. The skin injected with 1% lidocaine with epinephrine 1:200,000. A 15 blade was used to incise the skin and the incision carried down through the platysma. The strap muscles were divided in the midline and retracted laterally. There were a couple of anterior jugular veins, which  were clamped and suture ligated during this process. The left lobe of the thyroid gland was then exposed and immediately on the inferior aspect a brownish separate mass consistent with a parathyroid adenoma was located. This was carefully dissected out using the Harmonic scalpel to divide vascular attachments to the parathyroid adenoma. During the dissection, the recurrent laryngeal nerve on the left side was identified and preserved, located deep to the adenoma itself. The adenoma was sent for frozen section.   ____________________________ Sammuel Hines. Richardson Landry, MD psb:aw D: 08/25/2012 10:43:12 ET T: 08/25/2012 13:02:50 ET JOB#: [id number]  cc: Sammuel Hines. Richardson Landry, MD, <Dictator> Riley Nearing MD ELECTRONICALLY SIGNED 08/31/2012 8:08

## 2014-12-26 NOTE — Op Note (Signed)
PATIENT NAME:  Sierra Perkins, Sierra Perkins MR#:  [id number] DATE OF BIRTH:  10-22-1965  DATE OF PROCEDURE:  08/25/2012  ADDENDUM: Continuation of dictated Operative Report.  DESCRIPTION OF PROCEDURE: The parathyroid mass was sent for frozen section and confirmed to be a 1 gram parathyroid mass consistent with a parathyroid adenoma. The left side of the neck was inspected and minor bleeding controlled and attention was then turned to the right thyroid lobe. The right thyroid lobe was carefully dissected out dissecting up to the superior pole and dividing the superior vessels with the Harmonic scalpel. A small parathyroid gland appeared to be located in association with the superior pole vessels and was preserved. Dissection proceeded down along the lateral aspect of the gland and the gland was rotated medially. Careful dissection down into the tracheoesophageal groove region revealed the recurrent laryngeal nerve which was carefully traced superiorly to its entrance into the larynx as the remainder of the superior half of the thyroid gland was carefully dissected out dividing vascular attachments with the Harmonic scalpel. With the recurrent laryngeal nerve identified and confirmed with the nerve stimulator, the gland was palpated to identify the nodule. There was a firm area superomedially and this was resected cutting through the substance of the thyroid gland with the recurrent laryngeal nerve directly visualized, using the Harmonic scalpel. This portion of the thyroid gland was sent as a specimen labeled superomedial thyroid gland. There was a residual superolateral firm nodular area which was resected separately and sent as a superior lateral specimen. The inferior third of the thyroid gland remained and appeared to be free of masses. The wound was irrigated and suctioned to remove any blood clot and minor bleeding was subsequently controlled. Surgicel was placed on both sides of the dissection to control minor  oozing. A #10 TLS drain was then placed and secured to the skin with 3-0 nylon suture. The wound was then closed in layers with 4-0 Vicryl for the subcutaneous closure and 3-0 nylon in a running subcuticular stitch for the skin. Bacitracin ointment was applied and the patient was returned to the anesthesiologist for awakening. She was awakened and taken to the recovery room in good condition postoperatively. Blood loss was less than 50 mL. ____________________________ Sammuel Hines. Richardson Landry, MD psb:sb D: 08/25/2012 10:48:00 ET      T: 08/25/2012 13:03:29 ET JOB#: [id number]  cc: Sammuel Hines. Richardson Landry, MD, <Dictator> Riley Nearing MD ELECTRONICALLY SIGNED 08/31/2012 8:08

## 2014-12-29 NOTE — Op Note (Signed)
PATIENT NAME:  Sierra Perkins, Sierra Perkins MR#:  [id number] DATE OF BIRTH:  [birth year]  DATE OF PROCEDURE:  10/20/2012  PREOPERATIVE DIAGNOSIS: Right papillary thyroid carcinoma.   POSTOPERATIVE DIAGNOSIS: Right papillary thyroid carcinoma.  PROCEDURE: Completion total thyroidectomy (right and left lobes).   SURGEON: Malon Kindle, MD.  FIRST ASSISTANT: Margaretha Sheffield, MD.   ANESTHESIA: General endotracheal.   INDICATIONS: The patient with a history of left parathyroid adenoma with a right thyroid nodule that was removed with a subtotal thyroidectomy at the time of her parathyroid surgery. Unfortunately, the nodule proved to be papillary thyroid cancer, so she returns for completion thyroidectomy to remove the residual right thyroid lobe and all of the left thyroid lobe.   FINDINGS: Both thyroid lobes were removed completely along with a right paratracheal lymph node, which was sent. Frozen section confirmed that the right paratracheal node was indeed lymph node tissue and not a parathyroid gland.   COMPLICATIONS: None.   DESCRIPTION OF PROCEDURE: After obtaining informed consent, the patient was taken to the operating room and placed in the supine position. General endotracheal anesthesia was performed with a nerve monitoring probe applied to the endotracheal tube. This was placed under direct visualization to ensure its proper positioning  with the sensor between the vocal cords. The neck was then injected with 1% lidocaine with epinephrine 1:200,000 and her neck was prepped and draped in the usual sterile fashion. A 15 blade was used to incise the skin, re-excising the old scar. The incision was carried down through the platysma into the strap muscles. There was extensive scarring from the previous surgery, but the midline was identified and the strap muscles divided in the midline and retracted laterally. Dissection proceeded down to the anterior wall of the trachea. The left thyroid gland was  dissected first. Again, there was quite a bit of scar tissue, but I was able to identify the capsule of the gland and the capsular dissection was performed to remove the left thyroid gland. The superior pole was addressed first. This was dissected out and the superior pole vessels divided with the Harmonic scalpel. Dissection proceeded along the lateral aspect of the gland dividing the middle thyroid vein and then down to the inferior pole where the parathyroid gland had been removed during the previous surgery. There was a bit of scar tissue here, but I was able to ultimately identify the recurrent laryngeal nerve and this was dissected up under the thyroid gland, which was retracted medially and carefully dissected away leaving the recurrent laryngeal nerve intact and safe. Vascular attachments, including inferior pole vessels were divided with the Harmonic scalpel. The thyroid was retracted medially and dissected away from the tracheal wall at the region of Berry ligament with the recurrent laryngeal nerve directly visualized and the gland ultimately removed and sent for pathology.   Next, the right lobe of the thyroid gland was dissected. This dissection was a bit more difficult because of extensive scarring on the right side where she had had a subtotal thyroidectomy. The gland was ultimately found to be scarred up to the strap muscles and was carefully dissected away from the strap muscles creating a plane along the anterior face of the residual gland. The gland was still stuck superiorly and so we dissected inferiorly to find the recurrent laryngeal nerve. This was identified and confirmed with the nerve stimulator. The nerve was then dissected superiorly up under the residual gland and with the nerve directly visualized, I was able to carefully dissect the residual  of the right thyroid lobe out and remove it safely. During the dissection of the gland, a nodule was noted and this was removed as it appeared  to be a lymph node. This was sent for frozen section to confirm it was lymph node and not parathyroid tissue. Frozen section confirmed that this was lymph node and the remainder of the tissue was sent for  final pathology. Both recurrent laryngeal nerves were then reinspected and found to stimulate appropriately, confirming their function. The wound was irrigated with saline and minor oozing controlled with Surgicel. The strap muscles were reapproximated after placing a #10 TLS drain on either side of the trachea. The drain was secured with 3-0 Prolene suture. The subcutaneous tissues were closed with 4-0 Vicryl suture in an interrupted fashion. The skin was then closed with a running subcuticular Prolene stitch. The TLS drain was hooked up to Hemovac suction and bacitracin ointment applied to the wound. The patient was then returned to the anesthesiologist for awakening. She was awakened and taken to the recovery room in good condition postoperatively. Blood loss was approximately 75 mL.  ____________________________ Sammuel Hines. Richardson Landry, MD psb:aw D: 10/20/2012 10:38:41 ET T: 10/20/2012 10:53:51 ET JOB#: [id number]  cc: Sammuel Hines. Richardson Landry, MD, <Dictator> Riley Nearing MD ELECTRONICALLY SIGNED 11/23/2012 9:48

## 2015-03-08 ENCOUNTER — Ambulatory Visit: Payer: Self-pay | Admitting: Internal Medicine

## 2015-03-20 ENCOUNTER — Telehealth: Payer: Self-pay | Admitting: Family Medicine

## 2015-03-20 NOTE — Telephone Encounter (Signed)
Pt called needing an appt for dizziness, vertigo , weakness, muscle cramps.  Her call back is [phone].  Thanks, Con Memos

## 2015-03-20 NOTE — Telephone Encounter (Signed)
Made appointment for 03/29/2015 at 12pm  Thanks,   -Mickel Baas

## 2015-03-27 DIAGNOSIS — N6009 Solitary cyst of unspecified breast: Secondary | ICD-10-CM | POA: Insufficient documentation

## 2015-03-27 DIAGNOSIS — B373 Candidiasis of vulva and vagina: Secondary | ICD-10-CM | POA: Insufficient documentation

## 2015-03-27 DIAGNOSIS — C73 Malignant neoplasm of thyroid gland: Secondary | ICD-10-CM | POA: Insufficient documentation

## 2015-03-27 DIAGNOSIS — M1990 Unspecified osteoarthritis, unspecified site: Secondary | ICD-10-CM | POA: Insufficient documentation

## 2015-03-27 DIAGNOSIS — E213 Hyperparathyroidism, unspecified: Secondary | ICD-10-CM | POA: Insufficient documentation

## 2015-03-27 DIAGNOSIS — F4320 Adjustment disorder, unspecified: Secondary | ICD-10-CM | POA: Insufficient documentation

## 2015-03-27 DIAGNOSIS — B3731 Acute candidiasis of vulva and vagina: Secondary | ICD-10-CM | POA: Insufficient documentation

## 2015-03-29 ENCOUNTER — Encounter: Payer: Self-pay | Admitting: Family Medicine

## 2015-03-29 ENCOUNTER — Ambulatory Visit (INDEPENDENT_AMBULATORY_CARE_PROVIDER_SITE_OTHER): Payer: BLUE CROSS/BLUE SHIELD | Admitting: Family Medicine

## 2015-03-29 VITALS — BP 122/66 | HR 84 | Temp 97.8°F | Resp 16 | Ht 64.0 in | Wt 152.0 lb

## 2015-03-29 DIAGNOSIS — M1990 Unspecified osteoarthritis, unspecified site: Secondary | ICD-10-CM

## 2015-03-29 DIAGNOSIS — R202 Paresthesia of skin: Secondary | ICD-10-CM

## 2015-03-29 DIAGNOSIS — R42 Dizziness and giddiness: Secondary | ICD-10-CM | POA: Insufficient documentation

## 2015-03-29 DIAGNOSIS — R55 Syncope and collapse: Secondary | ICD-10-CM | POA: Diagnosis not present

## 2015-03-29 NOTE — Progress Notes (Signed)
Subjective:    Patient ID: Sierra Perkins, female    DOB: [birth year], 49 y.o.   MRN: [id number]  Dizziness This is a new problem. The current episode started more than 1 month ago (About six months ago. ). The problem occurs every several days. The problem has been gradually worsening (Worsening in the last six weeks. ). Associated symptoms include arthralgias, diaphoresis, fatigue, myalgias (Muscle cramps at night.), numbness (Tingling in arms and hands. ), vertigo and weakness. Pertinent negatives include no abdominal pain, chest pain, chills, congestion, coughing, fever, headaches, joint swelling, nausea, neck pain, sore throat or vomiting. The symptoms are aggravated by standing, exertion and bending.   Patient Active Problem List   Diagnosis Date Noted  . Adaptation reaction 03/27/2015  . Arthritis 03/27/2015  . Breast cyst 03/27/2015  . Calcium blood increased 03/27/2015  . HPTH (hyperparathyroidism) 03/27/2015  . Cancer of thyroid 03/27/2015  . Candida vaginitis 03/27/2015  . Cannot sleep 09/27/2009  . Breast lump 06/01/2007  . Adult hypothyroidism 02/15/2007  . Benign neoplasm of skin 02/03/2007  . Narrowing of intervertebral disc space 02/03/2007  . Bony exostosis 02/03/2007  . Diaphragmatic hernia 01/29/2007  . Acid reflux 01/29/2007   Family History  Problem Relation Age of Onset  . Hypertension Mother   . Arthritis Mother   . Diabetes Mother   . Heart disease Mother   . Heart attack Mother   . Rheum arthritis Mother   . Heart failure Father   . Arthritis Father   . Heart disease Father   . Emphysema Father   . Melanoma Father   . Colon cancer Father   . Diabetes Sister   . Stroke Sister 75  . Healthy Brother   . Arthritis Brother   . CAD Sister   . Hyperlipidemia Sister   . Heart attack Sister 57  . Rheum arthritis Other    History   Social History  . Marital Status: Married    Spouse Name: N/A  . Number of Children: 3  . Years of Education: N/A    Occupational History  . Not on file.   Social History Main Topics  . Smoking status: Never Smoker   . Smokeless tobacco: Never Used  . Alcohol Use: Yes     Comment: occ  . Drug Use: No  . Sexual Activity: Yes    Birth Control/ Protection: Condom, None, Post-menopausal   Other Topics Concern  . Not on file   Social History Narrative   Past Surgical History  Procedure Laterality Date  . Knee arthroscopy Bilateral   . Cesarean section    . Total thyroidectomy  10/20/2012    DR. BENNETT   No Known Allergies Previous Medications   LEVOTHYROXINE (SYNTHROID) 150 MCG TABLET    TAKE 1 TABLET BY MOUTH EVERY DAY TAKE ON EMPTY STOMACH WITH GLASS OF WATER AT LEAST 30-60 MIN. BEFOR   MELOXICAM (MOBIC) 7.5 MG TABLET    Take by mouth.   NAPROXEN SODIUM (ALEVE) 220 MG TABLET    Take 220 mg by mouth 2 (two) times daily with a meal. pain   RANITIDINE (ZANTAC) 150 MG TABLET    Take 150 mg by mouth 2 (two) times daily as needed for heartburn.   VITAMIN D, ERGOCALCIFEROL, (DRISDOL) 50000 UNITS CAPS CAPSULE    Take 1 capsule 2 x a week for 6 weeks then take 1 capsule po q weekly    BP 122/66 mmHg  Pulse 84  Temp(Src) 97.8 F (36.6  C) (Oral)  Resp 16  Ht 5\' 4"  (1.626 m)  Wt 152 lb (68.947 kg)  BMI 26.08 kg/m2  LMP 03/14/2015 (Exact Date)     Review of Systems  Constitutional: Positive for diaphoresis and fatigue. Negative for fever, chills, activity change, appetite change and unexpected weight change.  HENT: Positive for tinnitus (Hears a pulsing in her ear. ). Negative for congestion, ear discharge, ear pain, hearing loss, nosebleeds, postnasal drip, rhinorrhea, sinus pressure, sneezing, sore throat, trouble swallowing and voice change.   Eyes: Negative.   Respiratory: Negative for apnea, cough, choking, chest tightness, shortness of breath, wheezing and stridor.   Cardiovascular: Negative for chest pain, palpitations and leg swelling.  Gastrointestinal: Positive for diarrhea and  constipation. Negative for nausea, vomiting, abdominal pain, blood in stool, abdominal distention, anal bleeding and rectal pain.  Musculoskeletal: Positive for myalgias (Muscle cramps at night.), back pain and arthralgias. Negative for joint swelling, gait problem, neck pain and neck stiffness.  Neurological: Positive for dizziness, vertigo, weakness and numbness (Tingling in arms and hands. ). Negative for tremors, seizures, syncope and headaches.  Psychiatric/Behavioral: Positive for sleep disturbance.       Objective:   Physical Exam  Constitutional: She is oriented to person, place, and time. She appears well-developed and well-nourished.  Cardiovascular: Normal rate and regular rhythm.   Pulmonary/Chest: Effort normal and breath sounds normal.  Neurological: She is alert and oriented to person, place, and time.  Psychiatric: She has a normal mood and affect. Her behavior is normal. Judgment and thought content normal.   BP 122/66 mmHg  Pulse 84  Temp(Src) 97.8 F (36.6 C) (Oral)  Resp 16  Ht 5\' 4"  (1.626 m)  Wt 152 lb (68.947 kg)  BMI 26.08 kg/m2  LMP 03/14/2015 (Exact Date)       Assessment & Plan:  1. Vertigo Will refer.  Ambulatory referral to Neurology - Ambulatory referral to ENT  2. Pre-syncope Reviewed EKG with cardiology. Normal EKG. Follow up if any cardiac symptoms.  - EKG 12-Lead - Ambulatory referral to Neurology - CBC  3. Arthritis Await labs.  - B. Burgdorfi Antibodies  4. Calcium blood increased Recheck labs.  - Comprehensive metabolic panel  5. Paresthesia Worsening. Check labs and refer.  - Ambulatory referral to Neurology - B. Burgdorfi Antibodies - Vitamin B12 - CBC  Margarita Rana, MD

## 2015-03-30 LAB — COMPREHENSIVE METABOLIC PANEL
ALT: 13 IU/L (ref 0–32)
AST: 11 IU/L (ref 0–40)
Albumin/Globulin Ratio: 1.8 (ref 1.1–2.5)
Albumin: 4.3 g/dL (ref 3.5–5.5)
Alkaline Phosphatase: 69 IU/L (ref 39–117)
BILIRUBIN TOTAL: 0.2 mg/dL (ref 0.0–1.2)
BUN/Creatinine Ratio: 15 (ref 9–23)
BUN: 11 mg/dL (ref 6–24)
CO2: 26 mmol/L (ref 18–29)
Calcium: 9.4 mg/dL (ref 8.7–10.2)
Chloride: 99 mmol/L (ref 97–108)
Creatinine, Ser: 0.75 mg/dL (ref 0.57–1.00)
GFR calc Af Amer: 108 mL/min/{1.73_m2} (ref >59)
GFR, EST NON AFRICAN AMERICAN: 94 mL/min/{1.73_m2} (ref >59)
Globulin, Total: 2.4 g/dL (ref 1.5–4.5)
Glucose: 103 mg/dL — ABNORMAL HIGH (ref 65–99)
POTASSIUM: 4.2 mmol/L (ref 3.5–5.2)
Sodium: 141 mmol/L (ref 134–144)
TOTAL PROTEIN: 6.7 g/dL (ref 6.0–8.5)

## 2015-03-30 LAB — VITAMIN B12: VITAMIN B 12: 267 pg/mL (ref 211–946)

## 2015-03-30 LAB — CBC
HEMATOCRIT: 39.9 % (ref 34.0–46.6)
Hemoglobin: 13.4 g/dL (ref 11.1–15.9)
MCH: 29.5 pg (ref 26.6–33.0)
MCHC: 33.6 g/dL (ref 31.5–35.7)
MCV: 88 fL (ref 79–97)
Platelets: 297 10*3/uL (ref 150–379)
RBC: 4.54 x10E6/uL (ref 3.77–5.28)
RDW: 13.9 % (ref 12.3–15.4)
WBC: 8.6 10*3/uL (ref 3.4–10.8)

## 2015-03-30 LAB — B. BURGDORFI ANTIBODIES: Lyme IgG/IgM Ab: <0.91 {ISR} (ref 0.00–0.90)

## 2015-04-03 ENCOUNTER — Telehealth: Payer: Self-pay

## 2015-04-03 NOTE — Telephone Encounter (Signed)
Pt returning call.  SR#[phone]/WK

## 2015-04-03 NOTE — Telephone Encounter (Signed)
LMTCB 04/03/2015  Thanks,   -Mickel Baas

## 2015-04-03 NOTE — Telephone Encounter (Signed)
-----   Message from Margarita Rana, MD sent at 03/30/2015  4:57 PM EDT ----- Labs normal except for mildly low B12.  Recommend start B12 1000 mcg daily supplement and proceed with referrals as discussed.  Thanks.

## 2015-04-03 NOTE — Telephone Encounter (Signed)
Pt advised as directed below.   Thanks,   -Laura  

## 2015-07-02 ENCOUNTER — Encounter: Payer: Self-pay | Admitting: Family Medicine

## 2015-07-02 ENCOUNTER — Ambulatory Visit (INDEPENDENT_AMBULATORY_CARE_PROVIDER_SITE_OTHER): Payer: BLUE CROSS/BLUE SHIELD | Admitting: Family Medicine

## 2015-07-02 VITALS — BP 118/68 | HR 90 | Temp 98.0°F | Resp 18 | Wt 152.0 lb

## 2015-07-02 DIAGNOSIS — J029 Acute pharyngitis, unspecified: Secondary | ICD-10-CM | POA: Diagnosis not present

## 2015-07-02 DIAGNOSIS — J40 Bronchitis, not specified as acute or chronic: Secondary | ICD-10-CM

## 2015-07-02 LAB — POCT RAPID STREP A (OFFICE): RAPID STREP A SCREEN: NEGATIVE

## 2015-07-02 MED ORDER — HYDROCODONE-HOMATROPINE 5-1.5 MG/5ML PO SYRP: 5.0000 mL | ORAL_SOLUTION | Freq: Three times a day (TID) | ORAL | Status: DC | PRN

## 2015-07-02 NOTE — Progress Notes (Signed)
Patient ID: Sierra Perkins, female   DOB: Sep 30, 1965, 49 y.o.   MRN: [id number]       [name] Female    DOB: Jul 18, 1966   49 y.o.   MRN: [id number] Visit Date: 07/02/2015  Today's Provider: Margarita Rana, MD   Chief Complaint  Patient presents with  . URI    X 1 week.    Subjective:    URI  This is a new problem. The current episode started 1 to 4 weeks ago. The problem has been gradually worsening. There has been no fever. Associated symptoms include congestion, coughing, headaches, nausea, rhinorrhea, sneezing and a sore throat. She has tried decongestant for the symptoms. The treatment provided no relief.    Is concerned about strep infection as her throat has become so sore.  Has not had any known sick exposures.  Has not had any fever.      No Known Allergies Previous Medications   LEVOTHYROXINE (SYNTHROID) 150 MCG TABLET    TAKE 1 TABLET BY MOUTH EVERY DAY TAKE ON EMPTY STOMACH WITH GLASS OF WATER AT LEAST 30-60 MIN. BEFOR   MELOXICAM (MOBIC) 7.5 MG TABLET    Take by mouth.   NAPROXEN SODIUM (ALEVE) 220 MG TABLET    Take 220 mg by mouth 2 (two) times daily with a meal. pain   RANITIDINE (ZANTAC) 150 MG TABLET    Take 150 mg by mouth 2 (two) times daily as needed for heartburn.   VITAMIN D, ERGOCALCIFEROL, (DRISDOL) 50000 UNITS CAPS CAPSULE    Take 1 capsule 2 x a week for 6 weeks then take 1 capsule po q weekly    Review of Systems  HENT: Positive for congestion, rhinorrhea, sneezing and sore throat.   Respiratory: Positive for cough.   Gastrointestinal: Positive for nausea.  Neurological: Positive for headaches.    Social History  Substance Use Topics  . Smoking status: Never Smoker   . Smokeless tobacco: Never Used  . Alcohol Use: Yes     Comment: occ   Objective:   BP 118/68 mmHg  Pulse 90  Temp(Src) 98 F (36.7 C)  Resp 18  Wt 152 lb (68.947 kg)  SpO2 96%  Physical Exam  Constitutional: She appears well-developed and  well-nourished.  HENT:  Right Ear: External ear normal.  Left Ear: External ear normal.  Eyes: Conjunctivae and EOM are normal.  Cardiovascular: Normal rate, regular rhythm, normal heart sounds and intact distal pulses.   Pulmonary/Chest: Effort normal and breath sounds normal.  Vitals reviewed.     Assessment & Plan:     1. Sore throat Strep negative.  Will treat symptomatically.   - POCT rapid strep A  2. Bronchitis Cough is main complaint. Will treat so patient can sleep. Patient instructed to call back if condition worsens or does not improve.   May need antibiotic.  - HYDROcodone-homatropine (HYCODAN) 5-1.5 MG/5ML syrup; Take 5 mLs by mouth every 8 (eight) hours as needed for cough.  Dispense: 120 mL; Refill: 0   Patient was seen and examined by Jerrell Belfast, MD, and scribed by Wilburt Finlay, [place].  I have reviewed the document for accuracy and completeness and I agree with above. - Jerrell Belfast, MD     Margarita Rana, MD  [facility]

## 2015-08-17 ENCOUNTER — Telehealth: Payer: Self-pay

## 2015-08-17 NOTE — Telephone Encounter (Signed)
Patient called saying that she thinks that she may have a prolapsed uterus. She reports that she feels like something is coming out of her vagina. She reports that she has had this sensation over the last few months, but now it is becoming more uncomfortable. She reports that she is now starting to having light spotting. Her LMP was about 2 weeks ago, and was normal. Her cycles usually last 3-5 days. She denies having any incontinence or difficulty urinating. She denies any pain, just a discomfort. Patient reports that she does not usually see a GYN in the area. She reports that Dr. Venia Minks follows all of her GYN issues. Patient was scheduled an appt to be seen on Monday (08/20/15) at 4:30 to be evaluated.

## 2015-08-20 ENCOUNTER — Encounter: Payer: Self-pay | Admitting: Family Medicine

## 2015-08-20 ENCOUNTER — Ambulatory Visit (INDEPENDENT_AMBULATORY_CARE_PROVIDER_SITE_OTHER): Payer: BLUE CROSS/BLUE SHIELD | Admitting: Family Medicine

## 2015-08-20 VITALS — BP 108/76 | HR 92 | Temp 97.9°F | Resp 16 | Wt 156.0 lb

## 2015-08-20 DIAGNOSIS — N816 Rectocele: Secondary | ICD-10-CM | POA: Insufficient documentation

## 2015-08-20 NOTE — Progress Notes (Signed)
Subjective:    Patient ID: Sierra Perkins, female    DOB: [birth year], 49 y.o.   MRN: [id number]  Vaginal Pain The patient's primary symptoms include a genital odor, vaginal bleeding and vaginal discharge. The patient's pertinent negatives include no genital itching, genital lesions, genital rash, missed menses or pelvic pain. This is a new problem. The current episode started more than 1 month ago (x 3 months). The problem occurs intermittently. The problem has been gradually worsening. The patient is experiencing no pain ("just discomfort"). Associated symptoms include back pain, constipation, diarrhea and headaches (sinus). Pertinent negatives include no abdominal pain, anorexia, chills, discolored urine, dysuria, fever, frequency, painful intercourse or urgency. The vaginal discharge was white, clear and green. The vaginal bleeding is lighter than menses. She is sexually active. Her menstrual history has been regular.  Pt feels like this is caused by vaginal prolapse.    Review of Systems  Constitutional: Negative for fever and chills.  Gastrointestinal: Positive for diarrhea and constipation. Negative for abdominal pain and anorexia.  Genitourinary: Positive for vaginal discharge and vaginal pain. Negative for dysuria, urgency, frequency, pelvic pain and missed menses.  Musculoskeletal: Positive for back pain.  Neurological: Positive for headaches (sinus).   BP 108/76 mmHg  Pulse 92  Temp(Src) 97.9 F (36.6 C) (Oral)  Resp 16  Wt 156 lb (70.761 kg)  LMP 07/31/2015   Patient Active Problem List   Diagnosis Date Noted  . Bronchitis 07/02/2015  . Vertigo 03/29/2015  . Paresthesia 03/29/2015  . Adaptation reaction 03/27/2015  . Arthritis 03/27/2015  . Breast cyst 03/27/2015  . Calcium blood increased 03/27/2015  . HPTH (hyperparathyroidism) ([place]) 03/27/2015  . Cancer of thyroid ([place]) 03/27/2015  . Candida vaginitis 03/27/2015  . Cannot sleep 09/27/2009  . Breast lump  06/01/2007  . Adult hypothyroidism 02/15/2007  . Benign neoplasm of skin 02/03/2007  . Narrowing of intervertebral disc space 02/03/2007  . Bony exostosis 02/03/2007  . Diaphragmatic hernia 01/29/2007  . Acid reflux 01/29/2007   Past Medical History  Diagnosis Date  . Non-functioning kidney   . Diverticulosis   . Degenerative disk disease     C4-C7  . Thyroid cancer [facility])     February 2014   Current Outpatient Prescriptions on File Prior to Visit  Medication Sig  . levothyroxine (SYNTHROID) 150 MCG tablet TAKE 1 TABLET BY MOUTH EVERY DAY TAKE ON EMPTY STOMACH WITH GLASS OF WATER AT LEAST 30-60 MIN. BEFOR  . naproxen sodium (ALEVE) 220 MG tablet Take 220 mg by mouth 2 (two) times daily with a meal. pain  . ranitidine (ZANTAC) 150 MG tablet Take 150 mg by mouth 2 (two) times daily as needed for heartburn.  . Vitamin D, Ergocalciferol, (DRISDOL) 50000 UNITS CAPS capsule Take 1 capsule 2 x a week for 6 weeks then take 1 capsule po q weekly   No current facility-administered medications on file prior to visit.   No Known Allergies Past Surgical History  Procedure Laterality Date  . Knee arthroscopy Bilateral   . Cesarean section    . Total thyroidectomy  10/20/2012    DR. BENNETT   Social History   Social History  . Marital Status: Married    Spouse Name: N/A  . Number of Children: 3  . Years of Education: N/A   Occupational History  . Not on file.   Social History Main Topics  . Smoking status: Never Smoker   . Smokeless tobacco: Never Used  . Alcohol Use: Yes  Comment: occ  . Drug Use: No  . Sexual Activity: Yes    Birth Control/ Protection: Condom, None   Other Topics Concern  . Not on file   Social History Narrative   Family History  Problem Relation Age of Onset  . Hypertension Mother   . Arthritis Mother   . Diabetes Mother   . Heart disease Mother   . Heart attack Mother   . Rheum arthritis Mother   . Heart failure Father   . Arthritis Father     . Heart disease Father   . Emphysema Father   . Melanoma Father   . Colon cancer Father   . Diabetes Sister   . Stroke Sister 55  . Healthy Brother   . Arthritis Brother   . CAD Sister   . Hyperlipidemia Sister   . Heart attack Sister 57  . Rheum arthritis Other        Objective:   Physical Exam  Constitutional: She is oriented to person, place, and time. She appears well-developed and well-nourished.  Genitourinary: Uterus normal. Vaginal discharge found.  Rectocele noted with valsalva  Maneuver. Also, with some dark, bloody vaginal discharge.    Neurological: She is alert and oriented to person, place, and time.   BP 108/76 mmHg  Pulse 92  Temp(Src) 97.9 F (36.6 C) (Oral)  Resp 16  Wt 156 lb (70.761 kg)  LMP 07/31/2015      Assessment & Plan:  1. Rectocele New problem. May have other issues, not examined standing.  Will refer to Gyn for evaluation and treatment.    - Ambulatory referral to Obstetrics / Gynecology  Patient was seen and examined by Jerrell Belfast, MD, and note scribed by Renaldo Fiddler, CMA. I have reviewed the document for accuracy and completeness and I agree with above. Jerrell Belfast, MD   Margarita Rana, MD

## 2015-09-26 ENCOUNTER — Encounter: Payer: BLUE CROSS/BLUE SHIELD | Admitting: Obstetrics and Gynecology

## 2015-12-25 ENCOUNTER — Other Ambulatory Visit: Payer: Self-pay | Admitting: Orthopedic Surgery

## 2015-12-25 DIAGNOSIS — M25462 Effusion, left knee: Secondary | ICD-10-CM

## 2015-12-25 DIAGNOSIS — R531 Weakness: Secondary | ICD-10-CM

## 2015-12-25 DIAGNOSIS — M25562 Pain in left knee: Secondary | ICD-10-CM

## 2015-12-28 ENCOUNTER — Ambulatory Visit
Admission: RE | Admit: 2015-12-28 | Discharge: 2015-12-28 | Disposition: A | Discharge status: Home / Self Care | Payer: BLUE CROSS/BLUE SHIELD | Source: Ambulatory Visit | Attending: Orthopedic Surgery | Admitting: Orthopedic Surgery

## 2015-12-28 DIAGNOSIS — R531 Weakness: Secondary | ICD-10-CM

## 2015-12-28 DIAGNOSIS — M25562 Pain in left knee: Secondary | ICD-10-CM

## 2015-12-28 DIAGNOSIS — M25462 Effusion, left knee: Secondary | ICD-10-CM

## 2016-10-01 ENCOUNTER — Ambulatory Visit (INDEPENDENT_AMBULATORY_CARE_PROVIDER_SITE_OTHER): Payer: BLUE CROSS/BLUE SHIELD | Admitting: Physician Assistant

## 2016-10-01 ENCOUNTER — Encounter: Payer: Self-pay | Admitting: Physician Assistant

## 2016-10-01 VITALS — BP 104/62 | HR 92 | Temp 99.3°F | Resp 16 | Wt 150.0 lb

## 2016-10-01 DIAGNOSIS — R059 Cough, unspecified: Secondary | ICD-10-CM

## 2016-10-01 DIAGNOSIS — J069 Acute upper respiratory infection, unspecified: Secondary | ICD-10-CM | POA: Diagnosis not present

## 2016-10-01 DIAGNOSIS — R05 Cough: Secondary | ICD-10-CM

## 2016-10-01 LAB — POCT INFLUENZA A/B
Influenza A, POC: NEGATIVE
Influenza B, POC: NEGATIVE

## 2016-10-01 MED ORDER — HYDROCODONE-HOMATROPINE 5-1.5 MG/5ML PO SYRP: 5.0000 mL | ORAL_SOLUTION | Freq: Three times a day (TID) | ORAL | 0 refills | Status: DC | PRN

## 2016-10-01 NOTE — Patient Instructions (Signed)
Upper Respiratory Infection, Adult Most upper respiratory infections (URIs) are caused by a virus. A URI affects the nose, throat, and upper air passages. The most common type of URI is often called "the common cold." Follow these instructions at home:  Take medicines only as told by your doctor.  Gargle warm saltwater or take cough drops to comfort your throat as told by your doctor.  Use a warm mist humidifier or inhale steam from a shower to increase air moisture. This may make it easier to breathe.  Drink enough fluid to keep your pee (urine) clear or pale yellow.  Eat soups and other clear broths.  Have a healthy diet.  Rest as needed.  Go back to work when your fever is gone or your doctor says it is okay.  You may need to stay home longer to avoid giving your URI to others.  You can also wear a face mask and wash your hands often to prevent spread of the virus.  Use your inhaler more if you have asthma.  Do not use any tobacco products, including cigarettes, chewing tobacco, or electronic cigarettes. If you need help quitting, ask your doctor. Contact a doctor if:  You are getting worse, not better.  Your symptoms are not helped by medicine.  You have chills.  You are getting more short of breath.  You have brown or red mucus.  You have yellow or brown discharge from your nose.  You have pain in your face, especially when you bend forward.  You have a fever.  You have puffy (swollen) neck glands.  You have pain while swallowing.  You have white areas in the back of your throat. Get help right away if:  You have very bad or constant:  Headache.  Ear pain.  Pain in your forehead, behind your eyes, and over your cheekbones (sinus pain).  Chest pain.  You have long-lasting (chronic) lung disease and any of the following:  Wheezing.  Long-lasting cough.  Coughing up blood.  A change in your usual mucus.  You have a stiff neck.  You have  changes in your:  Vision.  Hearing.  Thinking.  Mood. This information is not intended to replace advice given to you by your health care provider. Make sure you discuss any questions you have with your health care provider. Document Released: 02/11/2008 Document Revised: 04/27/2016 Document Reviewed: 11/30/2013 Elsevier Interactive Patient Education  2017 [employer]  

## 2016-10-01 NOTE — Progress Notes (Signed)
[facility]  Chief Complaint  Patient presents with  . URI    Started Monday    Subjective:    Patient ID: Sierra Perkins, female    DOB: 09-09-1965, 51 y.o.   MRN: [id number]  Upper Respiratory Infection: Sierra Perkins is a 51 y.o. female complaining of symptoms of a URI. Symptoms include congestion, cough, sore throat and swollen glands. Onset of symptoms was 2 days ago, unchanged since that time. She also c/o achiness, congestion, low grade fever and nasal congestion for the past 2 days .  She is drinking plenty of fluids. Evaluation to date: none. Treatment to date: cough suppressants and decongestants. The treatment has provided minimal.   Review of Systems  Constitutional: Positive for fatigue. Negative for activity change, appetite change, chills, diaphoresis, fever and unexpected weight change.  HENT: Positive for congestion, ear pain (Left ear pain), postnasal drip, rhinorrhea and sore throat. Negative for ear discharge, sinus pain, sinus pressure and trouble swallowing.   Eyes: Negative.   Respiratory: Positive for cough and chest tightness. Negative for apnea, choking, shortness of breath, wheezing and stridor.   Gastrointestinal: Negative.   Musculoskeletal: Positive for arthralgias and myalgias.  Neurological: Positive for headaches. Negative for dizziness and light-headedness.       Objective:   BP 104/62 (BP Location: Right Arm, Patient Position: Sitting, Cuff Size: Normal)   Pulse 92   Temp 99.3 F (37.4 C) (Oral)   Resp 16   Wt 150 lb (68 kg)   LMP 09/02/2016   BMI 25.75 kg/m   Patient Active Problem List   Diagnosis Date Noted  . Rectocele 08/20/2015  . Bronchitis 07/02/2015  . Vertigo 03/29/2015  . Paresthesia 03/29/2015  . Adaptation reaction 03/27/2015  . Arthritis 03/27/2015  . Breast cyst 03/27/2015  . Calcium blood increased 03/27/2015  . HPTH (hyperparathyroidism) ([place]) 03/27/2015  . Cancer of  thyroid ([place]) 03/27/2015  . Candida vaginitis 03/27/2015  . Cannot sleep 09/27/2009  . Breast lump 06/01/2007  . Adult hypothyroidism 02/15/2007  . Benign neoplasm of skin 02/03/2007  . Narrowing of intervertebral disc space 02/03/2007  . Bony exostosis 02/03/2007  . Diaphragmatic hernia 01/29/2007  . Acid reflux 01/29/2007    Outpatient Encounter Prescriptions as of 10/01/2016  Medication Sig Note  . cyanocobalamin 1000 MCG tablet Take 100 mcg by mouth daily.   Marland Kitchen levothyroxine (SYNTHROID) 150 MCG tablet TAKE 1 TABLET BY MOUTH EVERY DAY TAKE ON EMPTY STOMACH WITH GLASS OF WATER AT LEAST 30-60 MIN. BEFOR 03/29/2015: Received from: [facility]  . naproxen sodium (ALEVE) 220 MG tablet Take 220 mg by mouth 2 (two) times daily with a meal. pain   . ranitidine (ZANTAC) 150 MG tablet Take 150 mg by mouth 2 (two) times daily as needed for heartburn.   . Vitamin D, Ergocalciferol, (DRISDOL) 50000 UNITS CAPS capsule Take 1 capsule 2 x a week for 6 weeks then take 1 capsule po q weekly 03/29/2015: Received from: [facility]  . HYDROcodone-homatropine (HYCODAN) 5-1.5 MG/5ML syrup Take 5 mLs by mouth every 8 (eight) hours as needed for cough.    No facility-administered encounter medications on file as of 10/01/2016.     No Known Allergies     Physical Exam  Constitutional: She appears well-developed and well-nourished.  HENT:  Right Ear: External ear normal.  Left Ear: External ear normal.  Mouth/Throat: Posterior oropharyngeal erythema present. No oropharyngeal exudate.  Eyes: Right eye exhibits discharge. Left eye  exhibits discharge.  Neck: Neck supple.  Cardiovascular: Normal rate and regular rhythm.   Pulmonary/Chest: Effort normal and breath sounds normal. No respiratory distress. She has no rales.  Lymphadenopathy:    She has no cervical adenopathy.  Skin: Skin is warm and dry.  Psychiatric: She has a normal mood and affect. Her behavior is normal.        Assessment & Plan:   1. Upper respiratory tract infection, unspecified type  Treat symptomatically as below.  2. Cough  Rapid flu negative. Treat symptomatically as below.  - POCT Influenza A/B - HYDROcodone-homatropine (HYCODAN) 5-1.5 MG/5ML syrup; Take 5 mLs by mouth every 8 (eight) hours as needed for cough.  Dispense: 120 mL; Refill: 0   Recommend rest, fluids, frequent hand washing. Work note provided  Return if symptoms worsen or fail to improve.   Patient Instructions  Upper Respiratory Infection, Adult Most upper respiratory infections (URIs) are caused by a virus. A URI affects the nose, throat, and upper air passages. The most common type of URI is often called "the common cold." Follow these instructions at home:  Take medicines only as told by your doctor.  Gargle warm saltwater or take cough drops to comfort your throat as told by your doctor.  Use a warm mist humidifier or inhale steam from a shower to increase air moisture. This may make it easier to breathe.  Drink enough fluid to keep your pee (urine) clear or pale yellow.  Eat soups and other clear broths.  Have a healthy diet.  Rest as needed.  Go back to work when your fever is gone or your doctor says it is okay.  You may need to stay home longer to avoid giving your URI to others.  You can also wear a face mask and wash your hands often to prevent spread of the virus.  Use your inhaler more if you have asthma.  Do not use any tobacco products, including cigarettes, chewing tobacco, or electronic cigarettes. If you need help quitting, ask your doctor. Contact a doctor if:  You are getting worse, not better.  Your symptoms are not helped by medicine.  You have chills.  You are getting more short of breath.  You have brown or red mucus.  You have yellow or brown discharge from your nose.  You have pain in your face, especially when you bend forward.  You have a fever.  You  have puffy (swollen) neck glands.  You have pain while swallowing.  You have white areas in the back of your throat. Get help right away if:  You have very bad or constant:  Headache.  Ear pain.  Pain in your forehead, behind your eyes, and over your cheekbones (sinus pain).  Chest pain.  You have long-lasting (chronic) lung disease and any of the following:  Wheezing.  Long-lasting cough.  Coughing up blood.  A change in your usual mucus.  You have a stiff neck.  You have changes in your:  Vision.  Hearing.  Thinking.  Mood. This information is not intended to replace advice given to you by your health care provider. Make sure you discuss any questions you have with your health care provider. Document Released: 02/11/2008 Document Revised: 04/27/2016 Document Reviewed: 11/30/2013 Elsevier Interactive Patient Education  2017 [employer].     The entirety of the information documented in the History of Present Illness, Review of Systems and Physical Exam were personally obtained by me. Portions of this information were  initially documented by Ashley Royalty, CMA and reviewed by me for thoroughness and accuracy.

## 2018-10-12 ENCOUNTER — Other Ambulatory Visit: Payer: Self-pay | Admitting: Radiology

## 2018-10-12 DIAGNOSIS — N6320 Unspecified lump in the left breast, unspecified quadrant: Secondary | ICD-10-CM

## 2018-10-18 ENCOUNTER — Ambulatory Visit
Admission: RE | Admit: 2018-10-18 | Discharge: 2018-10-18 | Disposition: A | Discharge status: Home / Self Care | Payer: BLUE CROSS/BLUE SHIELD | Source: Ambulatory Visit | Attending: Radiology | Admitting: Radiology

## 2018-10-18 DIAGNOSIS — N6320 Unspecified lump in the left breast, unspecified quadrant: Secondary | ICD-10-CM

## 2019-07-08 DIAGNOSIS — S129XXA Fracture of neck, unspecified, initial encounter: Secondary | ICD-10-CM | POA: Diagnosis not present

## 2019-07-08 DIAGNOSIS — M5441 Lumbago with sciatica, right side: Secondary | ICD-10-CM | POA: Diagnosis not present

## 2019-07-08 DIAGNOSIS — G8929 Other chronic pain: Secondary | ICD-10-CM | POA: Diagnosis not present

## 2019-07-16 DIAGNOSIS — F4322 Adjustment disorder with anxiety: Secondary | ICD-10-CM | POA: Diagnosis not present

## 2019-07-18 DIAGNOSIS — M5136 Other intervertebral disc degeneration, lumbar region: Secondary | ICD-10-CM | POA: Diagnosis not present

## 2019-07-18 DIAGNOSIS — M5416 Radiculopathy, lumbar region: Secondary | ICD-10-CM | POA: Diagnosis not present

## 2019-07-18 DIAGNOSIS — M5137 Other intervertebral disc degeneration, lumbosacral region: Secondary | ICD-10-CM | POA: Diagnosis not present

## 2019-07-18 DIAGNOSIS — M47816 Spondylosis without myelopathy or radiculopathy, lumbar region: Secondary | ICD-10-CM | POA: Diagnosis not present

## 2019-07-18 DIAGNOSIS — M5441 Lumbago with sciatica, right side: Secondary | ICD-10-CM | POA: Diagnosis not present

## 2019-07-20 DIAGNOSIS — E890 Postprocedural hypothyroidism: Secondary | ICD-10-CM | POA: Diagnosis not present

## 2019-07-20 DIAGNOSIS — C73 Malignant neoplasm of thyroid gland: Secondary | ICD-10-CM | POA: Diagnosis not present

## 2019-07-20 DIAGNOSIS — E559 Vitamin D deficiency, unspecified: Secondary | ICD-10-CM | POA: Diagnosis not present

## 2019-07-23 DIAGNOSIS — F4322 Adjustment disorder with anxiety: Secondary | ICD-10-CM | POA: Diagnosis not present

## 2019-07-27 DIAGNOSIS — R739 Hyperglycemia, unspecified: Secondary | ICD-10-CM | POA: Diagnosis not present

## 2019-07-27 DIAGNOSIS — E559 Vitamin D deficiency, unspecified: Secondary | ICD-10-CM | POA: Diagnosis not present

## 2019-07-27 DIAGNOSIS — C73 Malignant neoplasm of thyroid gland: Secondary | ICD-10-CM | POA: Diagnosis not present

## 2019-07-27 DIAGNOSIS — E890 Postprocedural hypothyroidism: Secondary | ICD-10-CM | POA: Diagnosis not present

## 2019-07-30 DIAGNOSIS — F4322 Adjustment disorder with anxiety: Secondary | ICD-10-CM | POA: Diagnosis not present

## 2019-08-13 DIAGNOSIS — F4322 Adjustment disorder with anxiety: Secondary | ICD-10-CM | POA: Diagnosis not present

## 2019-08-27 DIAGNOSIS — F4322 Adjustment disorder with anxiety: Secondary | ICD-10-CM | POA: Diagnosis not present

## 2019-09-20 DIAGNOSIS — M4120 Other idiopathic scoliosis, site unspecified: Secondary | ICD-10-CM | POA: Diagnosis not present

## 2019-09-20 DIAGNOSIS — R0781 Pleurodynia: Secondary | ICD-10-CM | POA: Diagnosis not present

## 2019-09-24 DIAGNOSIS — F4322 Adjustment disorder with anxiety: Secondary | ICD-10-CM | POA: Diagnosis not present

## 2019-10-01 DIAGNOSIS — F4322 Adjustment disorder with anxiety: Secondary | ICD-10-CM | POA: Diagnosis not present

## 2019-10-15 DIAGNOSIS — F4322 Adjustment disorder with anxiety: Secondary | ICD-10-CM | POA: Diagnosis not present

## 2019-12-08 DIAGNOSIS — M25561 Pain in right knee: Secondary | ICD-10-CM | POA: Diagnosis not present

## 2019-12-22 ENCOUNTER — Other Ambulatory Visit: Payer: Self-pay | Admitting: Orthopedic Surgery

## 2019-12-22 DIAGNOSIS — M25561 Pain in right knee: Secondary | ICD-10-CM

## 2020-01-05 ENCOUNTER — Other Ambulatory Visit: Payer: Self-pay | Admitting: Obstetrics and Gynecology

## 2020-01-05 DIAGNOSIS — F4322 Adjustment disorder with anxiety: Secondary | ICD-10-CM | POA: Diagnosis not present

## 2020-01-11 ENCOUNTER — Ambulatory Visit
Admission: RE | Admit: 2020-01-11 | Discharge: 2020-01-11 | Disposition: A | Discharge status: Home / Self Care | Payer: BC Managed Care – PPO | Source: Ambulatory Visit | Attending: Orthopedic Surgery | Admitting: Orthopedic Surgery

## 2020-01-11 ENCOUNTER — Other Ambulatory Visit: Payer: Self-pay

## 2020-01-11 DIAGNOSIS — M25561 Pain in right knee: Secondary | ICD-10-CM

## 2020-01-12 DIAGNOSIS — F4322 Adjustment disorder with anxiety: Secondary | ICD-10-CM | POA: Diagnosis not present

## 2020-01-26 DIAGNOSIS — F4322 Adjustment disorder with anxiety: Secondary | ICD-10-CM | POA: Diagnosis not present

## 2020-02-09 DIAGNOSIS — F4322 Adjustment disorder with anxiety: Secondary | ICD-10-CM | POA: Diagnosis not present

## 2020-05-22 DIAGNOSIS — F4322 Adjustment disorder with anxiety: Secondary | ICD-10-CM | POA: Diagnosis not present

## 2020-06-05 DIAGNOSIS — F4322 Adjustment disorder with anxiety: Secondary | ICD-10-CM | POA: Diagnosis not present

## 2020-06-19 DIAGNOSIS — F4322 Adjustment disorder with anxiety: Secondary | ICD-10-CM | POA: Diagnosis not present

## 2020-07-03 DIAGNOSIS — F4322 Adjustment disorder with anxiety: Secondary | ICD-10-CM | POA: Diagnosis not present

## 2020-07-17 DIAGNOSIS — F4322 Adjustment disorder with anxiety: Secondary | ICD-10-CM | POA: Diagnosis not present

## 2020-08-07 DIAGNOSIS — F4322 Adjustment disorder with anxiety: Secondary | ICD-10-CM | POA: Diagnosis not present

## 2020-08-21 DIAGNOSIS — E890 Postprocedural hypothyroidism: Secondary | ICD-10-CM | POA: Diagnosis not present

## 2020-08-21 DIAGNOSIS — C73 Malignant neoplasm of thyroid gland: Secondary | ICD-10-CM | POA: Diagnosis not present

## 2020-08-21 DIAGNOSIS — E559 Vitamin D deficiency, unspecified: Secondary | ICD-10-CM | POA: Diagnosis not present

## 2020-08-21 DIAGNOSIS — F4322 Adjustment disorder with anxiety: Secondary | ICD-10-CM | POA: Diagnosis not present

## 2020-08-21 DIAGNOSIS — R739 Hyperglycemia, unspecified: Secondary | ICD-10-CM | POA: Diagnosis not present

## 2020-08-22 DIAGNOSIS — C73 Malignant neoplasm of thyroid gland: Secondary | ICD-10-CM | POA: Diagnosis not present

## 2020-08-22 DIAGNOSIS — E559 Vitamin D deficiency, unspecified: Secondary | ICD-10-CM | POA: Diagnosis not present

## 2020-08-22 DIAGNOSIS — E890 Postprocedural hypothyroidism: Secondary | ICD-10-CM | POA: Diagnosis not present

## 2020-08-22 DIAGNOSIS — R739 Hyperglycemia, unspecified: Secondary | ICD-10-CM | POA: Diagnosis not present

## 2020-09-11 DIAGNOSIS — F4322 Adjustment disorder with anxiety: Secondary | ICD-10-CM | POA: Diagnosis not present

## 2020-09-25 DIAGNOSIS — F4322 Adjustment disorder with anxiety: Secondary | ICD-10-CM | POA: Diagnosis not present

## 2020-10-09 DIAGNOSIS — F4322 Adjustment disorder with anxiety: Secondary | ICD-10-CM | POA: Diagnosis not present

## 2020-10-16 IMAGING — MR MR KNEE*R* W/O CM
4 of 7 series · 21 of 40 positions shown · non-contrast
Comparison: MRI 02/15/2019

CLINICAL DATA: Right knee pain and swelling. Remote history of knee
surgery.

EXAM:
MRI OF THE RIGHT KNEE WITHOUT CONTRAST
TECHNIQUE: Multiplanar, multisequence MR imaging of the knee was performed. No
intravenous contrast was administered.

[Series 3: T2 fat-sat · axial · 4.0mm · 0.31mm/px · z∈[-67,+39]mm · 5 of 25 slices shown]
[im 1/25]
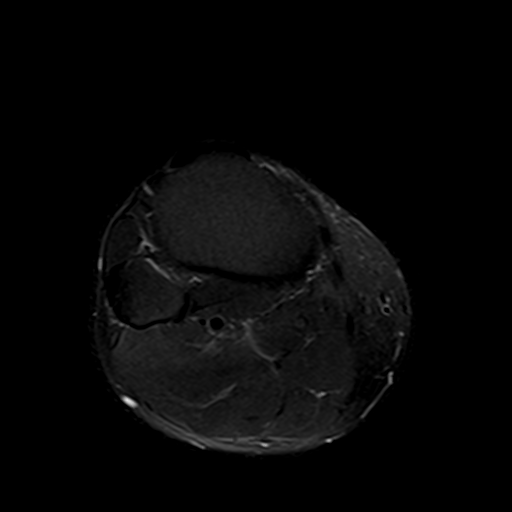
[im 7/25]
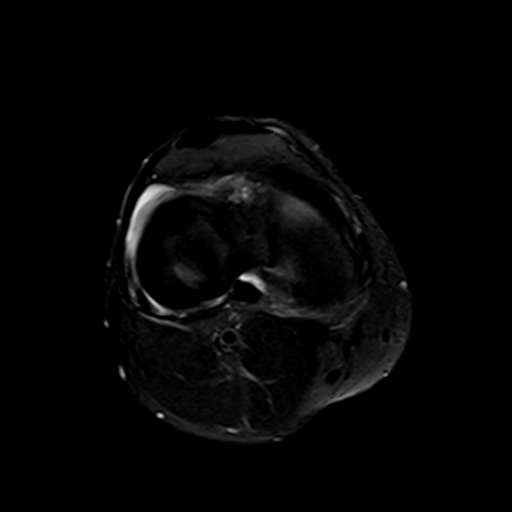
[im 13/25]
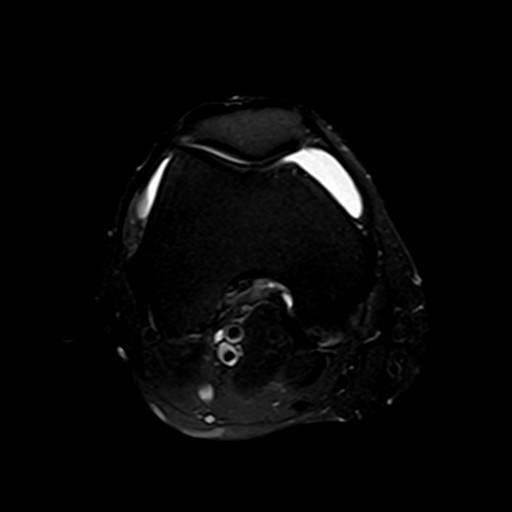
[im 19/25]
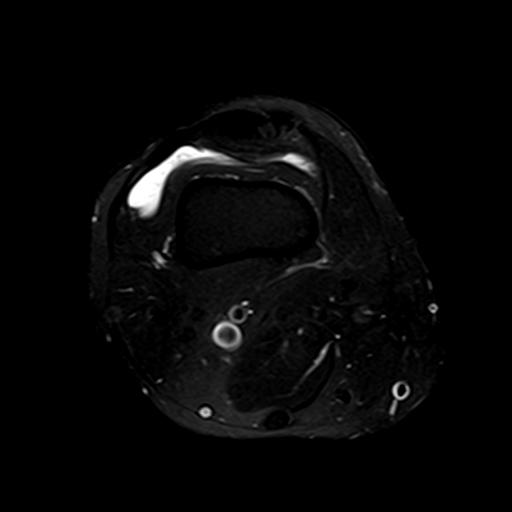
[im 25/25]
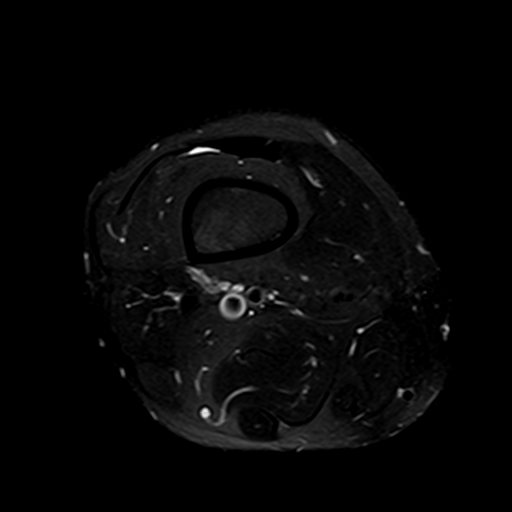

[Series 6: PD fat-sat · coronal · 4.0mm · 0.39mm/px · 6 of 24 slices shown (1 of 3)]
[im 1/24]
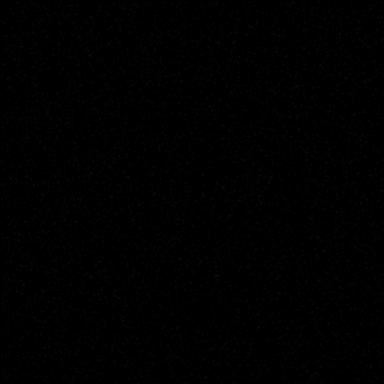
[im 5/24]
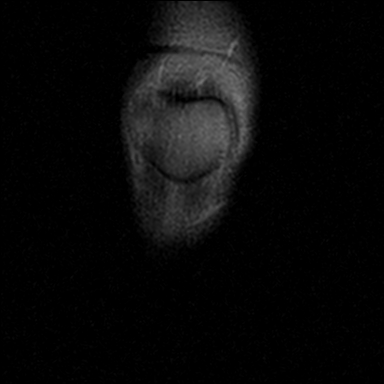
[im 10/24]
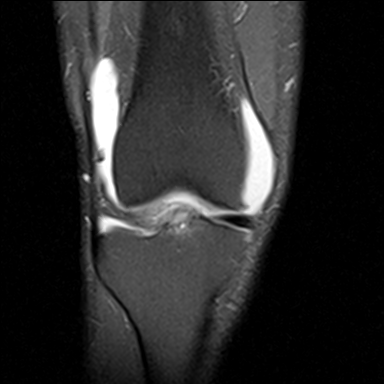
[im 14/24]
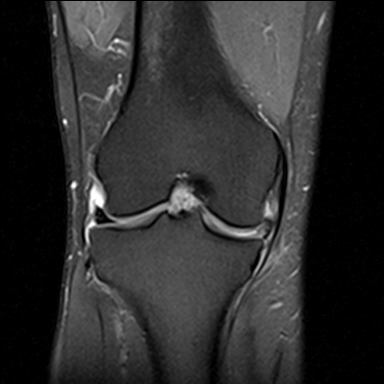
[im 19/24]
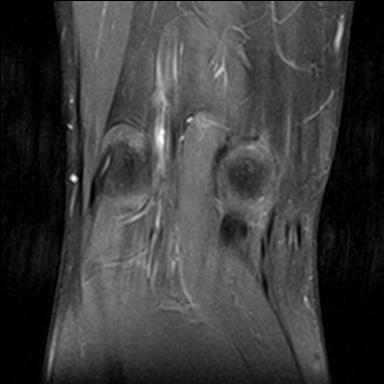
[im 24/24]
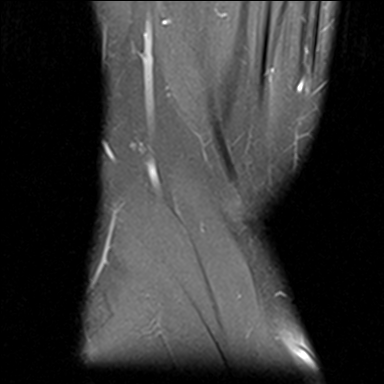

[Series 7: PD fat-sat · sagittal · 3.0mm · 0.29mm/px · 7 of 30 slices shown (2 of 3)]
[im 1/30]
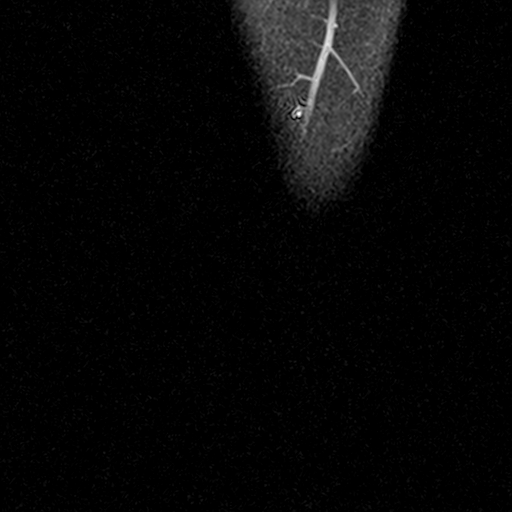
[im 5/30]
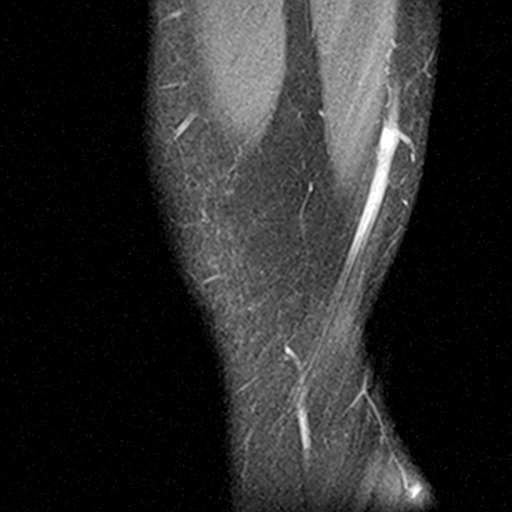
[im 10/30]
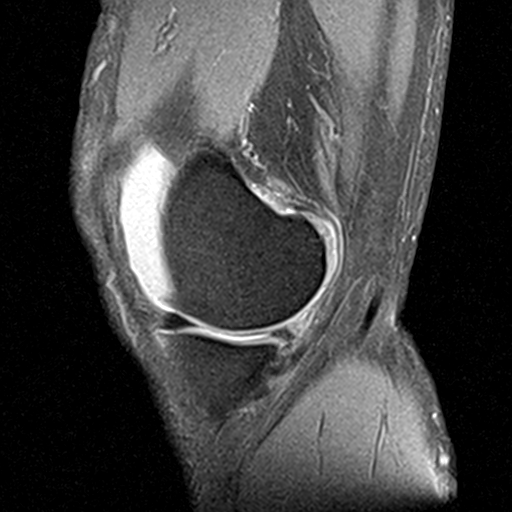
[im 15/30]
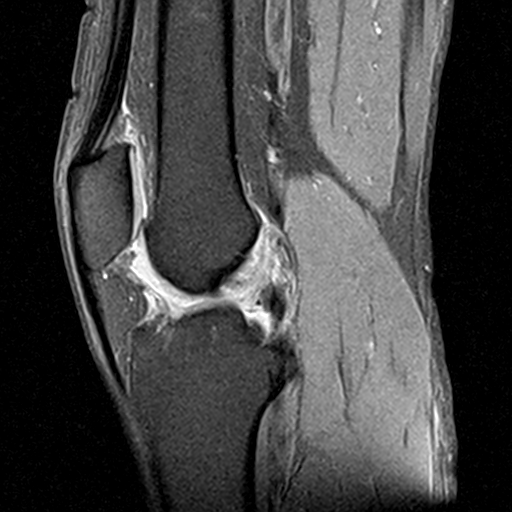
[im 20/30]
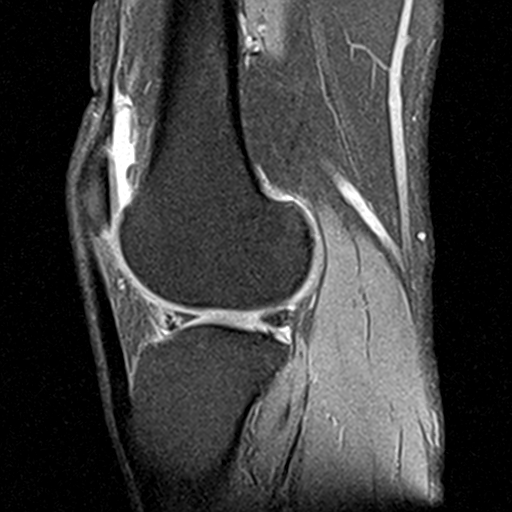
[im 25/30]
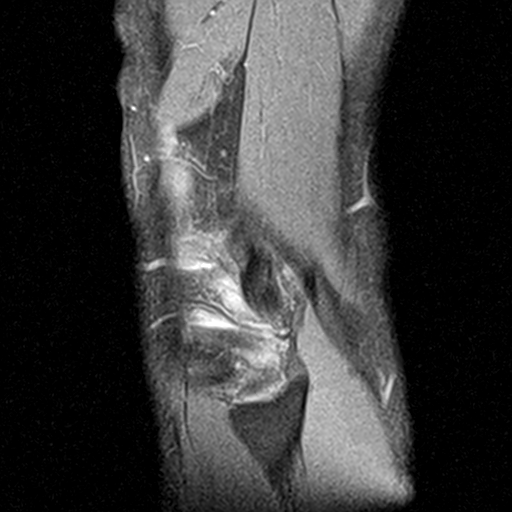
[im 30/30]
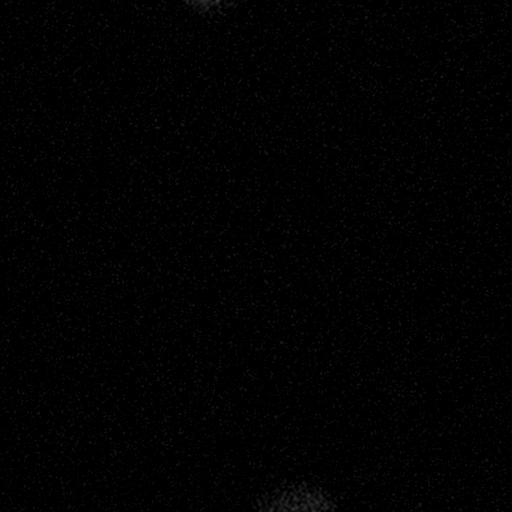

[Series 9: PD fat-sat · oblique · 2.0mm · 0.29mm/px · 3 of 11 slices shown (3 of 3)]
[im 1/11]
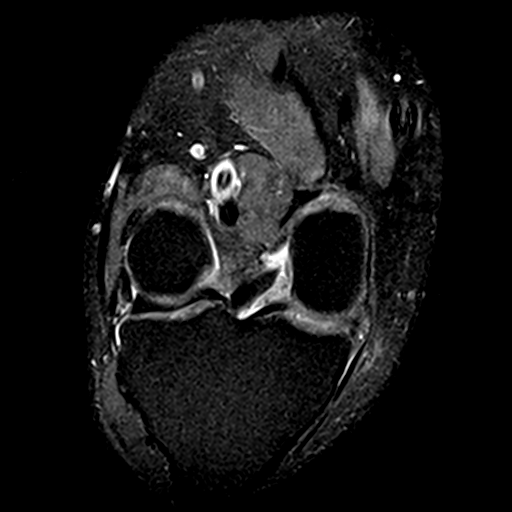
[im 6/11]
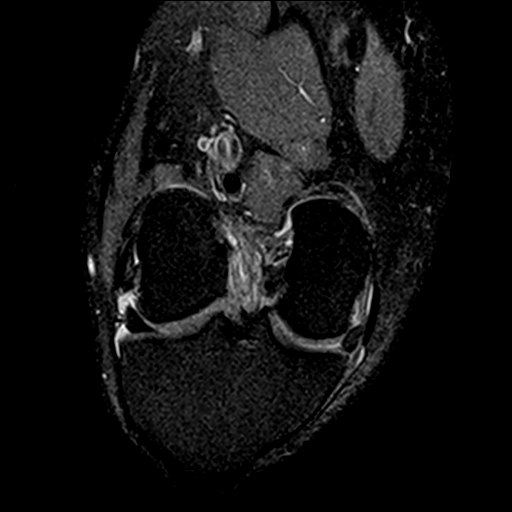
[im 11/11]
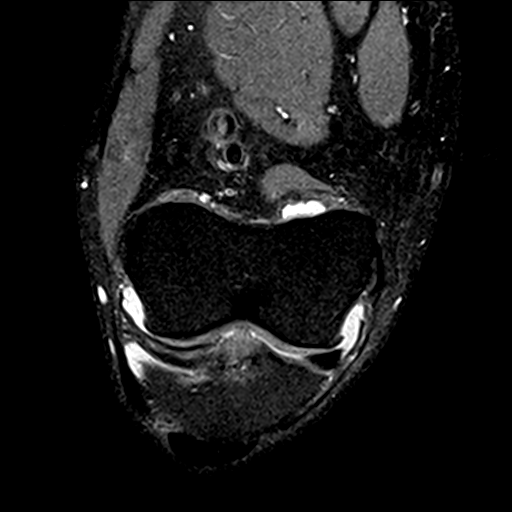

[21 of 40 positions shown; findings below may reference images not displayed]

FINDINGS: MENISCI

Medial meniscus: Diminutive appearance of the medial meniscal
posterior horn likely reflecting combination of prior debridement
and intrasubstance degeneration. Findings are similar in appearance
to the previous MRI. No well-defined new or recurrent medial
meniscal tear. Anterior and posterior root attachments remain
intact.

Lateral meniscus: Irregular high signal at the anterior root
attachment of the lateral meniscus extending to the inferior
articular surface is suspicious for tear (series 7, images 11-12).
Posterior horn and body segments of the lateral meniscus are intact.

LIGAMENTS

Cruciates: Mucoid degeneration of the anterior cruciate ligament
with associated ganglion, similar to prior. Intact PCL.

Collaterals: Medial collateral ligament is intact. Lateral
collateral ligament complex is intact.

CARTILAGE

Patellofemoral: Progressive trochlear chondral irregularities with
high-grade fissuring and probable delamination components within the
trochlear groove (series 3, images 14-15). No patellar chondral
defect.

Medial:  Similar mild chondral thinning and surface irregularity.

Lateral: Mild chondral thinning with partial-thickness chondral
defect involving the central lateral tibial plateau (series 5, image
10), new from prior.

Joint: Moderate sized knee joint effusion. Fat pads within normal
limits.

Popliteal Fossa:  No Baker cyst. Intact popliteus tendon.

Extensor Mechanism: Mild fusiform thickening of the lateral portion
of the patellar tendon is unchanged from prior. Quadriceps and
patellar tendons intact.

Bones: No focal marrow signal abnormality. No fracture or
dislocation.

Other: None.
IMPRESSION: 1. Irregular high signal at the anterior root attachment of the
lateral meniscus extending to the inferior articular surface is
suspicious for tear. This appears slightly more prominent compared
to the previous MRI.
2. Diminutive appearance of the medial meniscal posterior horn
likely reflecting combination of prior debridement and
intrasubstance degeneration. No well-defined new or recurrent medial
meniscal tear.
3. Progressive trochlear chondral irregularities with high-grade
fissuring and probable delamination components within the trochlear
groove.
4. Moderate sized knee joint effusion.
5. Mucoid degeneration of the anterior cruciate ligament with
associated ganglion, similar to prior.

## 2020-11-06 DIAGNOSIS — F4322 Adjustment disorder with anxiety: Secondary | ICD-10-CM | POA: Diagnosis not present

## 2020-12-13 DIAGNOSIS — M25572 Pain in left ankle and joints of left foot: Secondary | ICD-10-CM | POA: Diagnosis not present

## 2020-12-13 DIAGNOSIS — M79675 Pain in left toe(s): Secondary | ICD-10-CM | POA: Diagnosis not present

## 2021-01-01 DIAGNOSIS — F4322 Adjustment disorder with anxiety: Secondary | ICD-10-CM | POA: Diagnosis not present

## 2021-02-26 DIAGNOSIS — F4322 Adjustment disorder with anxiety: Secondary | ICD-10-CM | POA: Diagnosis not present

## 2021-04-01 DIAGNOSIS — G4700 Insomnia, unspecified: Secondary | ICD-10-CM | POA: Diagnosis not present

## 2021-04-01 DIAGNOSIS — M545 Low back pain, unspecified: Secondary | ICD-10-CM | POA: Diagnosis not present

## 2021-04-01 DIAGNOSIS — F411 Generalized anxiety disorder: Secondary | ICD-10-CM | POA: Diagnosis not present

## 2021-04-01 DIAGNOSIS — F331 Major depressive disorder, recurrent, moderate: Secondary | ICD-10-CM | POA: Diagnosis not present

## 2021-04-10 DIAGNOSIS — M25561 Pain in right knee: Secondary | ICD-10-CM | POA: Diagnosis not present

## 2021-04-10 DIAGNOSIS — G8918 Other acute postprocedural pain: Secondary | ICD-10-CM | POA: Diagnosis not present

## 2021-04-10 DIAGNOSIS — M1711 Unilateral primary osteoarthritis, right knee: Secondary | ICD-10-CM | POA: Diagnosis not present

## 2021-04-10 DIAGNOSIS — X58XXXA Exposure to other specified factors, initial encounter: Secondary | ICD-10-CM | POA: Diagnosis not present

## 2021-04-10 DIAGNOSIS — M659 Synovitis and tenosynovitis, unspecified: Secondary | ICD-10-CM | POA: Diagnosis not present

## 2021-04-10 DIAGNOSIS — S83231A Complex tear of medial meniscus, current injury, right knee, initial encounter: Secondary | ICD-10-CM | POA: Diagnosis not present

## 2021-04-10 DIAGNOSIS — Y999 Unspecified external cause status: Secondary | ICD-10-CM | POA: Diagnosis not present

## 2021-04-10 DIAGNOSIS — M94261 Chondromalacia, right knee: Secondary | ICD-10-CM | POA: Diagnosis not present

## 2021-07-02 DIAGNOSIS — F4322 Adjustment disorder with anxiety: Secondary | ICD-10-CM | POA: Diagnosis not present

## 2021-08-07 DIAGNOSIS — F4322 Adjustment disorder with anxiety: Secondary | ICD-10-CM | POA: Diagnosis not present

## 2021-08-13 DIAGNOSIS — C73 Malignant neoplasm of thyroid gland: Secondary | ICD-10-CM | POA: Diagnosis not present

## 2021-08-13 DIAGNOSIS — R739 Hyperglycemia, unspecified: Secondary | ICD-10-CM | POA: Diagnosis not present

## 2021-08-13 DIAGNOSIS — E559 Vitamin D deficiency, unspecified: Secondary | ICD-10-CM | POA: Diagnosis not present

## 2021-08-13 DIAGNOSIS — E890 Postprocedural hypothyroidism: Secondary | ICD-10-CM | POA: Diagnosis not present

## 2021-08-22 DIAGNOSIS — E890 Postprocedural hypothyroidism: Secondary | ICD-10-CM | POA: Diagnosis not present

## 2021-08-22 DIAGNOSIS — R739 Hyperglycemia, unspecified: Secondary | ICD-10-CM | POA: Diagnosis not present

## 2021-08-22 DIAGNOSIS — E559 Vitamin D deficiency, unspecified: Secondary | ICD-10-CM | POA: Diagnosis not present

## 2021-08-22 DIAGNOSIS — C73 Malignant neoplasm of thyroid gland: Secondary | ICD-10-CM | POA: Diagnosis not present

## 2021-09-10 DIAGNOSIS — F4322 Adjustment disorder with anxiety: Secondary | ICD-10-CM | POA: Diagnosis not present

## 2021-12-23 DIAGNOSIS — L814 Other melanin hyperpigmentation: Secondary | ICD-10-CM | POA: Diagnosis not present

## 2021-12-23 DIAGNOSIS — D224 Melanocytic nevi of scalp and neck: Secondary | ICD-10-CM | POA: Diagnosis not present

## 2021-12-23 DIAGNOSIS — D2261 Melanocytic nevi of right upper limb, including shoulder: Secondary | ICD-10-CM | POA: Diagnosis not present

## 2021-12-23 DIAGNOSIS — D2262 Melanocytic nevi of left upper limb, including shoulder: Secondary | ICD-10-CM | POA: Diagnosis not present

## 2022-01-02 DIAGNOSIS — M47816 Spondylosis without myelopathy or radiculopathy, lumbar region: Secondary | ICD-10-CM | POA: Diagnosis not present

## 2022-01-02 DIAGNOSIS — M25552 Pain in left hip: Secondary | ICD-10-CM | POA: Diagnosis not present

## 2022-01-02 DIAGNOSIS — M545 Low back pain, unspecified: Secondary | ICD-10-CM | POA: Diagnosis not present

## 2022-01-02 DIAGNOSIS — M25551 Pain in right hip: Secondary | ICD-10-CM | POA: Diagnosis not present

## 2022-01-07 ENCOUNTER — Emergency Department (HOSPITAL_BASED_OUTPATIENT_CLINIC_OR_DEPARTMENT_OTHER)
Admission: EM | Admit: 2022-01-07 | Discharge: 2022-01-07 | Disposition: A | Discharge status: Home / Self Care | Payer: BC Managed Care – PPO | Attending: Emergency Medicine | Admitting: Emergency Medicine

## 2022-01-07 ENCOUNTER — Other Ambulatory Visit: Payer: Self-pay

## 2022-01-07 ENCOUNTER — Encounter (HOSPITAL_BASED_OUTPATIENT_CLINIC_OR_DEPARTMENT_OTHER): Payer: Self-pay

## 2022-01-07 ENCOUNTER — Emergency Department (HOSPITAL_BASED_OUTPATIENT_CLINIC_OR_DEPARTMENT_OTHER): Payer: BC Managed Care – PPO

## 2022-01-07 DIAGNOSIS — S022XXA Fracture of nasal bones, initial encounter for closed fracture: Secondary | ICD-10-CM | POA: Insufficient documentation

## 2022-01-07 DIAGNOSIS — R220 Localized swelling, mass and lump, head: Secondary | ICD-10-CM | POA: Diagnosis not present

## 2022-01-07 DIAGNOSIS — Z23 Encounter for immunization: Secondary | ICD-10-CM | POA: Insufficient documentation

## 2022-01-07 DIAGNOSIS — S0992XA Unspecified injury of nose, initial encounter: Secondary | ICD-10-CM | POA: Diagnosis not present

## 2022-01-07 DIAGNOSIS — S0121XA Laceration without foreign body of nose, initial encounter: Secondary | ICD-10-CM

## 2022-01-07 DIAGNOSIS — S0993XA Unspecified injury of face, initial encounter: Secondary | ICD-10-CM | POA: Diagnosis not present

## 2022-01-07 MED ORDER — TETANUS-DIPHTH-ACELL PERTUSSIS 5-2.5-18.5 LF-MCG/0.5 IM SUSY
0.5000 mL | PREFILLED_SYRINGE | Freq: Once | INTRAMUSCULAR | Status: AC
  Administered 2022-01-07: 0.5 mL via INTRAMUSCULAR
  Filled 2022-01-07: qty 0.5
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
## 2022-01-07 NOTE — ED Notes (Signed)
Patient verbalizes understanding of discharge instructions. Opportunity for questioning and answers were provided. Patient discharged from ED.  °

## 2022-01-07 NOTE — Discharge Instructions (Signed)
In addition to your prescription naproxen you can take Tylenol as directed below. ?Please use ice to help with pain and swelling additionally. ? ?Please take Tylenol (acetaminophen) to relieve your pain.  You may take tylenol, up to 1,000 mg (two extra strength pills).  Do not take more than 3,000 mg tylenol in a 24 hour period.  Please check all medication labels as many medications such as pain and cold medications may contain tylenol. Please do not drink alcohol while taking this medication.  ? ?Please keep your laceration/scrape clean and dry.  You can put a thin layer of antibiotic ointment over it 3 times a day. ? ?If you develop fevers, have any new or concerning symptoms please seek additional medical care and evaluation. ?

## 2022-02-06 DIAGNOSIS — L03115 Cellulitis of right lower limb: Secondary | ICD-10-CM | POA: Diagnosis not present

## 2022-02-09 DIAGNOSIS — L03115 Cellulitis of right lower limb: Secondary | ICD-10-CM | POA: Diagnosis not present

## 2022-02-27 DIAGNOSIS — L918 Other hypertrophic disorders of the skin: Secondary | ICD-10-CM | POA: Diagnosis not present

## 2022-03-31 DIAGNOSIS — Z0000 Encounter for general adult medical examination without abnormal findings: Secondary | ICD-10-CM | POA: Diagnosis not present

## 2022-03-31 DIAGNOSIS — Z114 Encounter for screening for human immunodeficiency virus [HIV]: Secondary | ICD-10-CM | POA: Diagnosis not present

## 2022-04-04 DIAGNOSIS — E785 Hyperlipidemia, unspecified: Secondary | ICD-10-CM | POA: Diagnosis not present

## 2022-04-04 DIAGNOSIS — Z0000 Encounter for general adult medical examination without abnormal findings: Secondary | ICD-10-CM | POA: Diagnosis not present

## 2022-04-15 DIAGNOSIS — G4700 Insomnia, unspecified: Secondary | ICD-10-CM | POA: Diagnosis not present

## 2022-04-15 DIAGNOSIS — E538 Deficiency of other specified B group vitamins: Secondary | ICD-10-CM | POA: Diagnosis not present

## 2022-04-15 DIAGNOSIS — R5383 Other fatigue: Secondary | ICD-10-CM | POA: Diagnosis not present

## 2022-04-15 DIAGNOSIS — E559 Vitamin D deficiency, unspecified: Secondary | ICD-10-CM | POA: Diagnosis not present

## 2022-06-03 ENCOUNTER — Other Ambulatory Visit: Payer: Self-pay | Admitting: Sports Medicine

## 2022-06-03 DIAGNOSIS — M25551 Pain in right hip: Secondary | ICD-10-CM | POA: Diagnosis not present

## 2022-06-03 DIAGNOSIS — M545 Low back pain, unspecified: Secondary | ICD-10-CM

## 2022-06-03 DIAGNOSIS — Q7649 Other congenital malformations of spine, not associated with scoliosis: Secondary | ICD-10-CM

## 2022-06-03 DIAGNOSIS — M47816 Spondylosis without myelopathy or radiculopathy, lumbar region: Secondary | ICD-10-CM | POA: Diagnosis not present

## 2022-06-17 DIAGNOSIS — R7301 Impaired fasting glucose: Secondary | ICD-10-CM | POA: Diagnosis not present

## 2022-06-19 ENCOUNTER — Other Ambulatory Visit: Payer: BC Managed Care – PPO

## 2022-06-23 ENCOUNTER — Ambulatory Visit
Admission: RE | Admit: 2022-06-23 | Discharge: 2022-06-23 | Disposition: A | Discharge status: Home / Self Care | Payer: BC Managed Care – PPO | Source: Ambulatory Visit | Attending: Sports Medicine | Admitting: Sports Medicine

## 2022-06-23 DIAGNOSIS — M4807 Spinal stenosis, lumbosacral region: Secondary | ICD-10-CM | POA: Diagnosis not present

## 2022-06-23 DIAGNOSIS — M5127 Other intervertebral disc displacement, lumbosacral region: Secondary | ICD-10-CM | POA: Diagnosis not present

## 2022-06-23 DIAGNOSIS — M545 Low back pain, unspecified: Secondary | ICD-10-CM

## 2022-06-23 DIAGNOSIS — Q7649 Other congenital malformations of spine, not associated with scoliosis: Secondary | ICD-10-CM

## 2022-06-23 DIAGNOSIS — M48061 Spinal stenosis, lumbar region without neurogenic claudication: Secondary | ICD-10-CM | POA: Diagnosis not present

## 2022-07-01 DIAGNOSIS — M545 Low back pain, unspecified: Secondary | ICD-10-CM | POA: Diagnosis not present

## 2022-07-01 DIAGNOSIS — K219 Gastro-esophageal reflux disease without esophagitis: Secondary | ICD-10-CM | POA: Diagnosis not present

## 2022-07-01 DIAGNOSIS — E1165 Type 2 diabetes mellitus with hyperglycemia: Secondary | ICD-10-CM | POA: Diagnosis not present

## 2022-07-01 DIAGNOSIS — R030 Elevated blood-pressure reading, without diagnosis of hypertension: Secondary | ICD-10-CM | POA: Diagnosis not present

## 2022-07-03 ENCOUNTER — Other Ambulatory Visit: Payer: Self-pay | Admitting: Orthopedic Surgery

## 2022-07-03 DIAGNOSIS — M25562 Pain in left knee: Secondary | ICD-10-CM

## 2022-07-29 ENCOUNTER — Ambulatory Visit
Admission: RE | Admit: 2022-07-29 | Discharge: 2022-07-29 | Disposition: A | Discharge status: Home / Self Care | Payer: BC Managed Care – PPO | Source: Ambulatory Visit | Attending: Orthopedic Surgery | Admitting: Orthopedic Surgery

## 2022-07-29 DIAGNOSIS — M25562 Pain in left knee: Secondary | ICD-10-CM | POA: Diagnosis not present

## 2022-08-19 DIAGNOSIS — R739 Hyperglycemia, unspecified: Secondary | ICD-10-CM | POA: Diagnosis not present

## 2022-08-19 DIAGNOSIS — C73 Malignant neoplasm of thyroid gland: Secondary | ICD-10-CM | POA: Diagnosis not present

## 2022-08-19 DIAGNOSIS — E559 Vitamin D deficiency, unspecified: Secondary | ICD-10-CM | POA: Diagnosis not present

## 2022-08-19 DIAGNOSIS — E890 Postprocedural hypothyroidism: Secondary | ICD-10-CM | POA: Diagnosis not present

## 2022-08-25 DIAGNOSIS — I10 Essential (primary) hypertension: Secondary | ICD-10-CM | POA: Diagnosis not present

## 2022-08-25 DIAGNOSIS — E039 Hypothyroidism, unspecified: Secondary | ICD-10-CM | POA: Diagnosis not present

## 2022-08-25 DIAGNOSIS — K219 Gastro-esophageal reflux disease without esophagitis: Secondary | ICD-10-CM | POA: Diagnosis not present

## 2022-09-09 DIAGNOSIS — E890 Postprocedural hypothyroidism: Secondary | ICD-10-CM | POA: Diagnosis not present

## 2022-09-09 DIAGNOSIS — C73 Malignant neoplasm of thyroid gland: Secondary | ICD-10-CM | POA: Diagnosis not present

## 2022-09-09 DIAGNOSIS — E559 Vitamin D deficiency, unspecified: Secondary | ICD-10-CM | POA: Diagnosis not present

## 2022-10-07 DIAGNOSIS — R0781 Pleurodynia: Secondary | ICD-10-CM | POA: Diagnosis not present

## 2022-10-07 DIAGNOSIS — M546 Pain in thoracic spine: Secondary | ICD-10-CM | POA: Diagnosis not present

## 2022-10-08 ENCOUNTER — Encounter (HOSPITAL_COMMUNITY): Payer: Self-pay | Admitting: Emergency Medicine

## 2022-10-08 ENCOUNTER — Other Ambulatory Visit: Payer: Self-pay

## 2022-10-08 ENCOUNTER — Ambulatory Visit
Admission: RE | Admit: 2022-10-08 | Discharge: 2022-10-08 | Disposition: A | Discharge status: Home / Self Care | Payer: BC Managed Care – PPO | Source: Ambulatory Visit | Attending: Family Medicine | Admitting: Family Medicine

## 2022-10-08 ENCOUNTER — Other Ambulatory Visit: Payer: Self-pay | Admitting: Family Medicine

## 2022-10-08 ENCOUNTER — Emergency Department (HOSPITAL_COMMUNITY)
Admission: EM | Admit: 2022-10-08 | Discharge: 2022-10-09 | Disposition: A | Discharge status: Home / Self Care | Payer: BC Managed Care – PPO | Attending: Emergency Medicine | Admitting: Emergency Medicine

## 2022-10-08 DIAGNOSIS — R109 Unspecified abdominal pain: Secondary | ICD-10-CM

## 2022-10-08 DIAGNOSIS — K8020 Calculus of gallbladder without cholecystitis without obstruction: Secondary | ICD-10-CM | POA: Diagnosis not present

## 2022-10-08 DIAGNOSIS — E782 Mixed hyperlipidemia: Secondary | ICD-10-CM | POA: Diagnosis not present

## 2022-10-08 DIAGNOSIS — K449 Diaphragmatic hernia without obstruction or gangrene: Secondary | ICD-10-CM | POA: Insufficient documentation

## 2022-10-08 DIAGNOSIS — Z85850 Personal history of malignant neoplasm of thyroid: Secondary | ICD-10-CM | POA: Insufficient documentation

## 2022-10-08 DIAGNOSIS — K219 Gastro-esophageal reflux disease without esophagitis: Secondary | ICD-10-CM | POA: Diagnosis not present

## 2022-10-08 DIAGNOSIS — R1012 Left upper quadrant pain: Secondary | ICD-10-CM | POA: Diagnosis not present

## 2022-10-08 MED ORDER — ONDANSETRON 8 MG PO TBDP
8.0000 mg | ORAL_TABLET | Freq: Once | ORAL | Status: AC
  Administered 2022-10-08: 8 mg via ORAL
  Filled 2022-10-08: qty 1

## 2022-10-08 MED ORDER — OXYCODONE-ACETAMINOPHEN 5-325 MG PO TABS
1.0000 | ORAL_TABLET | Freq: Once | ORAL | Status: AC
  Administered 2022-10-08: 1 via ORAL
  Filled 2022-10-08: qty 1

## 2022-10-08 NOTE — ED Notes (Signed)
Pt ambulatory without assistance.  

## 2022-10-08 NOTE — ED Provider Notes (Incomplete)
[place] [facility] Provider Note   CSN: [id number] Arrival date & time: 10/08/22  2034     History {Add pertinent medical, surgical, social history, OB history to HPI:1} Chief Complaint  Patient presents with  . Abdominal Pain  . Abnormal CT    Sierra Perkins is a 57 y.o. female.  HPI   Medical history including diverticulosis, birth thyroid cancer, presents with complaints of left upper quadrant pain.  He states that she has been having left upper quadrant pain for about a years time, states it feels like a muscular strain, over the last week pain has gotten worse, does not radiate, remains constant, she has had no associated nausea or vomiting, states she is having some diarrhea but denies bloody stools or black tarry stools,  denies any urinary symptoms, she denies shortness of breath or pleuritic chest pain, no history of PEs or DVTs currently not on hormone therapy, no recent surgeries or long immobilizations.  She notes that she went to her primary care doctor who obtained a CT scan shows that she had a large hiatal hernia was told to come here for further evaluation.    Home Medications Prior to Admission medications   Medication Sig Start Date End Date Taking? Authorizing Provider  cyanocobalamin 1000 MCG tablet Take 100 mcg by mouth once a week.   Yes [provider]  famotidine (PEPCID) 40 MG tablet Take 40 mg by mouth daily as needed for heartburn or indigestion. 09/29/22  Yes [provider]  levothyroxine (SYNTHROID) 150 MCG tablet TAKE 1 TABLET BY MOUTH EVERY DAY TAKE ON EMPTY STOMACH WITH GLASS OF WATER AT LEAST 30-60 MIN. BEFOR 03/13/15  Yes [provider]  naproxen sodium (ALEVE) 220 MG tablet Take 220 mg by mouth 2 (two) times daily as needed (pain). pain   Yes [provider]  Vitamin D, Ergocalciferol, (DRISDOL) 50000 UNITS CAPS capsule Take 50,000 Units by mouth every 14 (fourteen) days.  10/04/14  Yes [provider]  HYDROcodone-homatropine (HYCODAN) 5-1.5 MG/5ML syrup Take 5 mLs by mouth every 8 (eight) hours as needed for cough. Patient not taking: Reported on 10/08/2022 10/01/16   Trinna Post, PA-C      Allergies    Patient has no known allergies.    Review of Systems   Review of Systems  Constitutional:  Negative for chills and fever.  Respiratory:  Negative for shortness of breath.   Cardiovascular:  Negative for chest pain.  Gastrointestinal:  Positive for abdominal pain.  Neurological:  Negative for headaches.    Physical Exam Updated Vital Signs BP 124/85   Pulse 89   Temp 97.9 F (36.6 C)   Resp 18   Ht '5\' 4"'$  (1.626 m)   Wt 67.1 kg   SpO2 95%   BMI 25.40 kg/m  Physical Exam Vitals and nursing note reviewed.  Constitutional:      General: She is not in acute distress.    Appearance: She is not ill-appearing.  HENT:     Head: Normocephalic and atraumatic.     Nose: No congestion.  Eyes:     Conjunctiva/sclera: Conjunctivae normal.  Cardiovascular:     Rate and Rhythm: Normal rate and regular rhythm.     Pulses: Normal pulses.     Heart sounds: No murmur heard.    No friction rub. No gallop.  Pulmonary:     Effort: No respiratory distress.     Breath sounds: No wheezing, rhonchi  or rales.     Comments: No evidence of respiratory distress nontachypneic nonhypoxic lung sounds are clear bilaterally. Abdominal:     Palpations: Abdomen is soft.     Tenderness: There is abdominal tenderness. There is no right CVA tenderness or left CVA tenderness.     Comments: Abdomen nondistended, soft, slight tenderness noted in the left upper quadrant, without guarding, rebound tenderness, peritoneal sign.  Musculoskeletal:     Comments: No unilateral leg swelling no calf tenderness no palpable cords.  Skin:    General: Skin is warm and dry.  Neurological:     Mental Status: She is alert.  Psychiatric:        Mood and Affect: Mood normal.      ED Results / Procedures / Treatments   Labs (all labs ordered are listed, but only abnormal results are displayed) Labs Reviewed - No data to display  EKG None  Radiology CT ABDOMEN PELVIS WO CONTRAST  Result Date: 10/08/2022 CLINICAL DATA:  BILATERAL rib pain for 1 week, history of Caesarean section, thyroid cancer post thyroidectomy EXAM: CT ABDOMEN AND PELVIS WITHOUT CONTRAST TECHNIQUE: Multidetector CT imaging of the abdomen and pelvis was performed following the standard protocol without IV contrast. RADIATION DOSE REDUCTION: This exam was performed according to the departmental dose-optimization program which includes automated exposure control, adjustment of the mA and/or kV according to patient size and/or use of iterative reconstruction technique. COMPARISON:  01/28/2013 FINDINGS: Lower chest: Mild compressive atelectasis medial RIGHT lower lobe. Lungs otherwise clear. Hepatobiliary: Cholelithiasis.  No focal hepatic abnormalities. Pancreas: Extends into a hiatal hernia sac adjacent to stomach. Otherwise normal pancreas without mass. Spleen: Normal appearance Adrenals/Urinary Tract: Marked cortical atrophy and scarring at lower pole LEFT kidney. Tiny nonobstructing LEFT renal calculi. Adrenal glands, kidneys, and ureters normal appearance. Bladder well distended without mass or wall thickening. Low descent of urinary bladder in pelvis consistent with cystocele. Stomach/Bowel: Normal appendix. Large hiatal hernia. Extensive diverticulosis of colon without evidence of diverticulitis. Remaining bowel loops unremarkable. Vascular/Lymphatic: Aorta normal caliber.  No adenopathy. Reproductive: Unremarkable uterus and ovaries Other: Small umbilical hernia containing fat. No free air or free fluid. No inflammatory process. Musculoskeletal: Unremarkable IMPRESSION: Large hiatal hernia containing stomach and portion of pancreas. Extensive colonic diverticulosis without evidence of diverticulitis.  Cholelithiasis. Marked cortical atrophy and scarring at lower pole LEFT kidney with tiny LEFT renal calculi. Low descent of urinary bladder in pelvis consistent with cystocele. Small umbilical hernia containing fat. No acute intra-abdominal or intrapelvic abnormalities. Electronically Signed   By: Lavonia Dana M.D.   On: 10/08/2022 13:10    Procedures Procedures  {Document cardiac monitor, telemetry assessment procedure when appropriate:1}  Medications Ordered in ED Medications  oxyCODONE-acetaminophen (PERCOCET/ROXICET) 5-325 MG per tablet 1 tablet (1 tablet Oral Given 10/08/22 2351)  ondansetron (ZOFRAN-ODT) disintegrating tablet 8 mg (8 mg Oral Given 10/08/22 2351)    ED Course/ Medical Decision Making/ A&P   {   Click here for ABCD2, HEART and other calculatorsREFRESH Note before signing :1}                          Medical Decision Making Risk Prescription drug management.   This patient presents to the ED for concern of left upper quadrant pain, this involves an extensive number of treatment options, and is a complaint that carries with it a high risk of complications and morbidity.  The differential diagnosis includes PE, ACS, diverticulitis, bowel obstruction  Additional history obtained:  Additional history obtained from husband at bedside External records from outside source obtained and reviewed including PCP notes   Co morbidities that complicate the patient evaluation  N/A  Social Determinants of Health:  N/A    Lab Tests:  I Ordered, and personally interpreted labs.  The pertinent results include: N/A   Imaging Studies ordered:  I ordered imaging studies including N/A I independently visualized and interpreted imaging which showed N/A I agree with the radiologist interpretation   Cardiac Monitoring:  The patient was maintained on a cardiac monitor.  I personally viewed and interpreted the cardiac monitored which showed an underlying rhythm of:  N/A   Medicines ordered and prescription drug management:  I ordered medication including N/A I have reviewed the patients home medicines and have made adjustments as needed  Critical Interventions:  N/A   Reevaluation:  Presents with left upper quadrant tenderness, seen at her primary care doctor where CT scan was obtained, personally reviewed the imaging shows large hiatal hernia containing stomach and portion of the pancreas,  Consultations Obtained:  I requested consultation with the ***,  and discussed lab and imaging findings as well as pertinent plan - they recommend: ***    Test Considered:  ***    Rule out ****    Dispostion and problem list  After consideration of the diagnostic results and the patients response to treatment, I feel that the patent would benefit from ***.       {Document critical care time when appropriate:1} {Document review of labs and clinical decision tools ie heart score, Chads2Vasc2 etc:1}  {Document your independent review of radiology images, and any outside records:1} {Document your discussion with family members, caretakers, and with consultants:1} {Document social determinants of health affecting pt's care:1} {Document your decision making why or why not admission, treatments were needed:1} Final Clinical Impression(s) / ED Diagnoses Final diagnoses:  None    Rx / DC Orders ED Discharge Orders     None

## 2022-10-08 NOTE — ED Triage Notes (Addendum)
Pt reports LUQ abdominal pain into ribs. Pt reports hx of hiatal hernia and had CT and blood work. pt was sent here due to hernia pushing into  stomach and part of pancreas. Pt denies emesis.

## 2022-10-08 NOTE — ED Notes (Signed)
Urine specimen obtained, placed at bedside if needed. 

## 2022-10-08 NOTE — ED Provider Notes (Signed)
[place] AT [facility] Provider Note   CSN: [id number] Arrival date & time: 10/08/22  2034     History  Chief Complaint  Patient presents with   Abdominal Pain   Abnormal CT    Sierra Perkins is a 57 y.o. female.  HPI   Medical history including diverticulosis, birth thyroid cancer, presents with complaints of left upper quadrant pain.  He states that she has been having left upper quadrant pain for about a years time, states it feels like a muscular strain, over the last week pain has gotten worse, does not radiate, remains constant, she has had no associated nausea or vomiting, states she is having some diarrhea but denies bloody stools or black tarry stools,  denies any urinary symptoms, she denies shortness of breath or pleuritic chest pain, no history of PEs or DVTs currently not on hormone therapy, no recent surgeries or long immobilizations.  She notes that she went to her primary care doctor who obtained a CT scan shows that she had a large hiatal hernia was told to come here for further evaluation.    Home Medications Prior to Admission medications   Medication Sig Start Date End Date Taking? Authorizing Provider  cyanocobalamin 1000 MCG tablet Take 100 mcg by mouth once a week.   Yes [provider]  famotidine (PEPCID) 40 MG tablet Take 40 mg by mouth daily as needed for heartburn or indigestion. 09/29/22  Yes [provider]  levothyroxine (SYNTHROID) 150 MCG tablet TAKE 1 TABLET BY MOUTH EVERY DAY TAKE ON EMPTY STOMACH WITH GLASS OF WATER AT LEAST 30-60 MIN. BEFOR 03/13/15  Yes [provider]  naproxen sodium (ALEVE) 220 MG tablet Take 220 mg by mouth 2 (two) times daily as needed (pain). pain   Yes [provider]  Vitamin D, Ergocalciferol, (DRISDOL) 50000 UNITS CAPS capsule Take 50,000 Units by mouth every 14 (fourteen) days. 10/04/14  Yes [provider]  HYDROcodone-homatropine (HYCODAN)  5-1.5 MG/5ML syrup Take 5 mLs by mouth every 8 (eight) hours as needed for cough. Patient not taking: Reported on 10/08/2022 10/01/16   Trinna Post, PA-C  ondansetron (ZOFRAN) 4 MG tablet Take 1 tablet (4 mg total) by mouth every 6 (six) hours. 10/09/22  Yes Marcello Fennel, PA-C  oxyCODONE-acetaminophen (PERCOCET/ROXICET) 5-325 MG tablet Take 1 tablet by mouth every 8 (eight) hours as needed for up to 3 days for severe pain. 10/09/22 10/12/22 Yes Marcello Fennel, PA-C      Allergies    Patient has no known allergies.    Review of Systems   Review of Systems  Constitutional:  Negative for chills and fever.  Respiratory:  Negative for shortness of breath.   Cardiovascular:  Negative for chest pain.  Gastrointestinal:  Positive for abdominal pain.  Neurological:  Negative for headaches.    Physical Exam Updated Vital Signs BP 124/85   Pulse 89   Temp 97.9 F (36.6 C)   Resp 18   Ht '5\' 4"'$  (1.626 m)   Wt 67.1 kg   SpO2 95%   BMI 25.40 kg/m  Physical Exam Vitals and nursing note reviewed.  Constitutional:      General: She is not in acute distress.    Appearance: She is not ill-appearing.  HENT:     Head: Normocephalic and atraumatic.     Nose: No congestion.  Eyes:     Conjunctiva/sclera: Conjunctivae normal.  Cardiovascular:     Rate and Rhythm:  Normal rate and regular rhythm.     Pulses: Normal pulses.     Heart sounds: No murmur heard.    No friction rub. No gallop.  Pulmonary:     Effort: No respiratory distress.     Breath sounds: No wheezing, rhonchi or rales.     Comments: No evidence of respiratory distress nontachypneic nonhypoxic lung sounds are clear bilaterally. Abdominal:     Palpations: Abdomen is soft.     Tenderness: There is abdominal tenderness. There is no right CVA tenderness or left CVA tenderness.     Comments: Abdomen nondistended, soft, slight tenderness noted in the left upper quadrant, without guarding, rebound tenderness, peritoneal  sign.  Musculoskeletal:     Comments: No unilateral leg swelling no calf tenderness no palpable cords.  Skin:    General: Skin is warm and dry.  Neurological:     Mental Status: She is alert.  Psychiatric:        Mood and Affect: Mood normal.     ED Results / Procedures / Treatments   Labs (all labs ordered are listed, but only abnormal results are displayed) Labs Reviewed - No data to display  EKG None  Radiology CT ABDOMEN PELVIS WO CONTRAST  Result Date: 10/08/2022 CLINICAL DATA:  BILATERAL rib pain for 1 week, history of Caesarean section, thyroid cancer post thyroidectomy EXAM: CT ABDOMEN AND PELVIS WITHOUT CONTRAST TECHNIQUE: Multidetector CT imaging of the abdomen and pelvis was performed following the standard protocol without IV contrast. RADIATION DOSE REDUCTION: This exam was performed according to the departmental dose-optimization program which includes automated exposure control, adjustment of the mA and/or kV according to patient size and/or use of iterative reconstruction technique. COMPARISON:  01/28/2013 FINDINGS: Lower chest: Mild compressive atelectasis medial RIGHT lower lobe. Lungs otherwise clear. Hepatobiliary: Cholelithiasis.  No focal hepatic abnormalities. Pancreas: Extends into a hiatal hernia sac adjacent to stomach. Otherwise normal pancreas without mass. Spleen: Normal appearance Adrenals/Urinary Tract: Marked cortical atrophy and scarring at lower pole LEFT kidney. Tiny nonobstructing LEFT renal calculi. Adrenal glands, kidneys, and ureters normal appearance. Bladder well distended without mass or wall thickening. Low descent of urinary bladder in pelvis consistent with cystocele. Stomach/Bowel: Normal appendix. Large hiatal hernia. Extensive diverticulosis of colon without evidence of diverticulitis. Remaining bowel loops unremarkable. Vascular/Lymphatic: Aorta normal caliber.  No adenopathy. Reproductive: Unremarkable uterus and ovaries Other: Small umbilical  hernia containing fat. No free air or free fluid. No inflammatory process. Musculoskeletal: Unremarkable IMPRESSION: Large hiatal hernia containing stomach and portion of pancreas. Extensive colonic diverticulosis without evidence of diverticulitis. Cholelithiasis. Marked cortical atrophy and scarring at lower pole LEFT kidney with tiny LEFT renal calculi. Low descent of urinary bladder in pelvis consistent with cystocele. Small umbilical hernia containing fat. No acute intra-abdominal or intrapelvic abnormalities. Electronically Signed   By: Lavonia Dana M.D.   On: 10/08/2022 13:10    Procedures Procedures    Medications Ordered in ED Medications  oxyCODONE-acetaminophen (PERCOCET/ROXICET) 5-325 MG per tablet 1 tablet (1 tablet Oral Given 10/08/22 2351)  ondansetron (ZOFRAN-ODT) disintegrating tablet 8 mg (8 mg Oral Given 10/08/22 2351)    ED Course/ Medical Decision Making/ A&P                             Medical Decision Making Risk Prescription drug management.   This patient presents to the ED for concern of left upper quadrant pain, this involves an extensive number of treatment options, and  is a complaint that carries with it a high risk of complications and morbidity.  The differential diagnosis includes PE, ACS, diverticulitis, bowel obstruction    Additional history obtained:  Additional history obtained from husband at bedside External records from outside source obtained and reviewed including PCP notes   Co morbidities that complicate the patient evaluation  N/A  Social Determinants of Health:  N/A    Lab Tests:  I Ordered, and personally interpreted labs.  The pertinent results include: N/A   Imaging Studies ordered:  I ordered imaging studies including N/A I independently visualized and interpreted imaging which showed N/A I agree with the radiologist interpretation   Cardiac Monitoring:  The patient was maintained on a cardiac monitor.  I personally  viewed and interpreted the cardiac monitored which showed an underlying rhythm of: N/A   Medicines ordered and prescription drug management:  I ordered medication including N/A I have reviewed the patients home medicines and have made adjustments as needed  Critical Interventions:  N/A   Reevaluation:  Presents with left upper quadrant tenderness, seen at her primary care doctor where CT scan was obtained, personally reviewed the imaging shows large hiatal hernia containing stomach and portion of the pancreas, no acute abnormalities present.  She had a benign abdominal exam, patient is in agreement with discharge at this time.  Consultations Obtained:  N/a    Test Considered:  Considered obtaining basic lab work i.e. CMP, CBC, lipase rule out possibly of biliary or hepatic abnormalities, the patient deferred as she had this done earlier today.  I am unable to review it, but she had a benign physical exam she is nontoxic-appearing I find this agreeable at this time.    Rule out low suspicion for lower lobe pneumonia as lung sounds are clear bilaterally, will defer imaging at this time.  Suspicion for PE is also very low at this time as presentation is atypical of etiology, no recent surgeries no long immobilization no unilateral leg swelling no palpable cords, patient has CT imaging which shows  large hiatal hernia which is consistent with patient's pain.  Suspicion for volvulus, bowel obstruction, diverticulitis, appendicitis, AAA is low at this time as CT imaging is negative these findings.  Suspicion for cholecystitis is low at this time as she has no upper quadrant tenderness, no evidence of infection seen on CT imaging, presentation and age of etiology.  I doubt pancreatitis as there is no evidence seen on CT imaging.    Dispostion and problem list  After consideration of the diagnostic results and the patients response to treatment, I feel that the patent would benefit from  discharge.   Hiatal hernia-patient will be given some pain medication, antiemetics, and referred to general surgery for further evaluation and strict return precautions.           Final Clinical Impression(s) / ED Diagnoses Final diagnoses:  Hiatal hernia    Rx / DC Orders ED Discharge Orders          Ordered    oxyCODONE-acetaminophen (PERCOCET/ROXICET) 5-325 MG tablet  Every 8 hours PRN        10/09/22 0007    ondansetron (ZOFRAN) 4 MG tablet  Every 6 hours        10/09/22 0007              Marcello Fennel, PA-C 10/09/22 0008    Merryl Hacker, MD 10/14/22 [phone]

## 2022-10-09 MED ORDER — OXYCODONE-ACETAMINOPHEN 5-325 MG PO TABS: 1.0000 | ORAL_TABLET | Freq: Three times a day (TID) | ORAL | 0 refills | Status: AC | PRN

## 2022-10-09 MED ORDER — ONDANSETRON HCL 4 MG PO TABS: 4.0000 mg | ORAL_TABLET | Freq: Four times a day (QID) | ORAL | 0 refills | Status: DC

## 2022-10-09 NOTE — Discharge Instructions (Signed)
Your imaging shows that you have a hiatal hernia, I have given you pain medications please take prescribed, was given Zofran please take as prescribed.  I have given you a short course of narcotics please take as prescribed.  This medication can make you drowsy do not consume alcohol or operate heavy machinery when taking this medication.  This medication is Tylenol in it do not take Tylenol and take this medication.      Please follow-up with general surgery for further evaluation  Come back to the emergency department if you develop chest pain, shortness of breath, severe abdominal pain, uncontrolled nausea, vomiting, diarrhea.

## 2022-10-29 DIAGNOSIS — R6881 Early satiety: Secondary | ICD-10-CM | POA: Diagnosis not present

## 2022-10-29 DIAGNOSIS — K449 Diaphragmatic hernia without obstruction or gangrene: Secondary | ICD-10-CM | POA: Diagnosis not present

## 2022-10-29 DIAGNOSIS — Z531 Procedure and treatment not carried out because of patient's decision for reasons of belief and group pressure: Secondary | ICD-10-CM | POA: Diagnosis not present

## 2022-11-04 ENCOUNTER — Other Ambulatory Visit: Payer: Self-pay | Admitting: General Surgery

## 2022-11-04 DIAGNOSIS — K449 Diaphragmatic hernia without obstruction or gangrene: Secondary | ICD-10-CM

## 2022-11-10 DIAGNOSIS — K449 Diaphragmatic hernia without obstruction or gangrene: Secondary | ICD-10-CM | POA: Diagnosis not present

## 2022-11-10 DIAGNOSIS — U071 COVID-19: Secondary | ICD-10-CM | POA: Diagnosis not present

## 2022-11-10 DIAGNOSIS — R933 Abnormal findings on diagnostic imaging of other parts of digestive tract: Secondary | ICD-10-CM | POA: Diagnosis not present

## 2022-11-10 DIAGNOSIS — R030 Elevated blood-pressure reading, without diagnosis of hypertension: Secondary | ICD-10-CM | POA: Diagnosis not present

## 2022-11-10 DIAGNOSIS — I10 Essential (primary) hypertension: Secondary | ICD-10-CM | POA: Diagnosis not present

## 2022-11-10 DIAGNOSIS — R634 Abnormal weight loss: Secondary | ICD-10-CM | POA: Diagnosis not present

## 2022-11-10 DIAGNOSIS — R1084 Generalized abdominal pain: Secondary | ICD-10-CM | POA: Diagnosis not present

## 2022-11-24 ENCOUNTER — Ambulatory Visit
Admission: RE | Admit: 2022-11-24 | Discharge: 2022-11-24 | Disposition: A | Discharge status: Home / Self Care | Payer: BC Managed Care – PPO | Source: Ambulatory Visit | Attending: General Surgery | Admitting: General Surgery

## 2022-11-24 DIAGNOSIS — R1310 Dysphagia, unspecified: Secondary | ICD-10-CM | POA: Diagnosis not present

## 2022-11-24 DIAGNOSIS — K224 Dyskinesia of esophagus: Secondary | ICD-10-CM | POA: Diagnosis not present

## 2022-11-24 DIAGNOSIS — K219 Gastro-esophageal reflux disease without esophagitis: Secondary | ICD-10-CM | POA: Diagnosis not present

## 2022-11-24 DIAGNOSIS — K449 Diaphragmatic hernia without obstruction or gangrene: Secondary | ICD-10-CM

## 2022-11-25 DIAGNOSIS — E782 Mixed hyperlipidemia: Secondary | ICD-10-CM | POA: Diagnosis not present

## 2022-11-25 DIAGNOSIS — I10 Essential (primary) hypertension: Secondary | ICD-10-CM | POA: Diagnosis not present

## 2022-11-25 DIAGNOSIS — E1165 Type 2 diabetes mellitus with hyperglycemia: Secondary | ICD-10-CM | POA: Diagnosis not present

## 2022-11-25 DIAGNOSIS — E039 Hypothyroidism, unspecified: Secondary | ICD-10-CM | POA: Diagnosis not present

## 2022-12-02 DIAGNOSIS — E039 Hypothyroidism, unspecified: Secondary | ICD-10-CM | POA: Diagnosis not present

## 2022-12-02 DIAGNOSIS — R933 Abnormal findings on diagnostic imaging of other parts of digestive tract: Secondary | ICD-10-CM | POA: Diagnosis not present

## 2022-12-02 DIAGNOSIS — E1121 Type 2 diabetes mellitus with diabetic nephropathy: Secondary | ICD-10-CM | POA: Diagnosis not present

## 2022-12-02 DIAGNOSIS — K449 Diaphragmatic hernia without obstruction or gangrene: Secondary | ICD-10-CM | POA: Diagnosis not present

## 2022-12-02 DIAGNOSIS — E782 Mixed hyperlipidemia: Secondary | ICD-10-CM | POA: Diagnosis not present

## 2022-12-12 ENCOUNTER — Ambulatory Visit: Payer: Self-pay | Admitting: General Surgery

## 2022-12-12 DIAGNOSIS — K449 Diaphragmatic hernia without obstruction or gangrene: Secondary | ICD-10-CM

## 2022-12-12 NOTE — Progress Notes (Signed)
Please place orders for PAT appointment scheduled 12/15/22.

## 2022-12-12 NOTE — Progress Notes (Signed)
COVID Vaccine Completed:  Date of COVID positive in last 90 days:  PCP - Maryelizabeth Rowan, MD Cardiologist -   Chest x-ray -  EKG -  Stress Test -  ECHO -  Cardiac Cath -  Pacemaker/ICD device last checked: Spinal Cord Stimulator:  Bowel Prep -   Sleep Study -  CPAP -   Fasting Blood Sugar - A1C 8 Checks Blood Sugar _____ times a day  Last dose of GLP1 agonist-  N/A GLP1 instructions:  N/A   Last dose of SGLT-2 inhibitors-  N/A SGLT-2 instructions: N/A   Blood Thinner Instructions: Aspirin Instructions: Last Dose:  Activity level:  Can go up a flight of stairs and perform activities of daily living without stopping and without symptoms of chest pain or shortness of breath.  Able to exercise without symptoms  Unable to go up a flight of stairs without symptoms of     Anesthesia review:   Patient denies shortness of breath, fever, cough and chest pain at PAT appointment  Patient verbalized understanding of instructions that were given to them at the PAT appointment. Patient was also instructed that they will need to review over the PAT instructions again at home before surgery.

## 2022-12-12 NOTE — Patient Instructions (Signed)
SURGICAL WAITING ROOM VISITATION  Patients having surgery or a procedure may have no more than 2 support people in the waiting area - these visitors may rotate.    Children under the age of 58 must have an adult with them who is not the patient.  Due to an increase in RSV and influenza rates and associated hospitalizations, children ages 712 and under may not visit patients in [facility] hospitals.  If the patient needs to stay at the hospital during part of their recovery, the visitor guidelines for inpatient rooms apply. Pre-op nurse will coordinate an appropriate time for 1 support person to accompany patient in pre-op.  This support person may not rotate.    Please refer to the [facility] website for the visitor guidelines for Inpatients (after your surgery is over and you are in a regular room).    Your procedure is scheduled on: 01/06/23   Report to [facility] Main Entrance    Report to admitting at 5:15 AM   Call this number if you have problems the morning of surgery [phone]   Do not eat food :After Midnight.   After Midnight you may have the following liquids until 4:30 AM DAY OF SURGERY  Water Non-Citrus Juices (without pulp, NO RED-Apple, White grape, White cranberry) Black Coffee (NO MILK/CREAM OR CREAMERS, sugar ok)  Clear Tea (NO MILK/CREAM OR CREAMERS, sugar ok) regular and decaf                             Plain Jell-O (NO RED)                                           Fruit ices (not with fruit pulp, NO RED)                                     Popsicles (NO RED)                                                               Sports drinks like Gatorade (NO RED)              Drink 2 Ensure/G2 drinks AT 10:00 PM the night before surgery.        The day of surgery:  Drink ONE (1) Pre-Surgery Clear Ensure or G2 at AM the morning of surgery. Drink in one sitting. Do not sip.  This drink was given to you during your hospital  pre-op appointment  visit. Nothing else to drink after completing the  Pre-Surgery Clear Ensure or G2.          If you have questions, please contact your surgeon's office.   FOLLOW BOWEL PREP AND ANY ADDITIONAL PRE OP INSTRUCTIONS YOU RECEIVED FROM YOUR SURGEON'S OFFICE!!!     Oral Hygiene is also important to reduce your risk of infection.  Remember - BRUSH YOUR TEETH THE MORNING OF SURGERY WITH YOUR REGULAR TOOTHPASTE  DENTURES WILL BE REMOVED PRIOR TO SURGERY PLEASE DO NOT APPLY "Poly grip" OR ADHESIVES!!!   Do NOT smoke after Midnight   Take these medicines the morning of surgery with A SIP OF WATER: Synthroid, Omeprazole   DO NOT TAKE ANY ORAL DIABETIC MEDICATIONS DAY OF YOUR SURGERY  Bring CPAP mask and tubing day of surgery.                              You may not have any metal on your body including hair pins, jewelry, and body piercing             Do not wear make-up, lotions, powders, perfumes, or deodorant  Do not wear nail polish including gel and S&S, artificial/acrylic nails, or any other type of covering on natural nails including finger and toenails. If you have artificial nails, gel coating, etc. that needs to be removed by a nail salon please have this removed prior to surgery or surgery may need to be canceled/ delayed if the surgeon/ anesthesia feels like they are unable to be safely monitored.   Do not shave  48 hours prior to surgery.    Do not bring valuables to the hospital. [facility] IS NOT             RESPONSIBLE   FOR VALUABLES.   Contacts, glasses, dentures or bridgework may not be worn into surgery.   Bring small overnight bag day of surgery.   DO NOT BRING YOUR HOME MEDICATIONS TO THE HOSPITAL. PHARMACY WILL DISPENSE MEDICATIONS LISTED ON YOUR MEDICATION LIST TO YOU DURING YOUR ADMISSION IN THE HOSPITAL!   Special Instructions: Bring a copy of your healthcare power of attorney and living will documents the day of surgery if you  haven't scanned them before.              Please read over the following fact sheets you were given: IF YOU HAVE QUESTIONS ABOUT YOUR PRE-OP INSTRUCTIONS PLEASE CALL [name]- Sierra Perkins    If you received a COVID test during your pre-op visit  it is requested that you wear a mask when out in public, stay away from anyone that may not be feeling well and notify your surgeon if you develop symptoms. If you test positive for Covid or have been in contact with anyone that has tested positive in the last 10 days please notify you surgeon.    [facility] - Preparing for Surgery Before surgery, you can play an important role.  Because skin is not sterile, your skin needs to be as free of germs as possible.  You can reduce the number of germs on your skin by washing with CHG (chlorahexidine gluconate) soap before surgery.  CHG is an antiseptic cleaner which kills germs and bonds with the skin to continue killing germs even after washing. Please DO NOT use if you have an allergy to CHG or antibacterial soaps.  If your skin becomes reddened/irritated stop using the CHG and inform your nurse when you arrive at Short Stay. Do not shave (including legs and underarms) for at least 48 hours prior to the first CHG shower.  You may shave your face/neck.  Please follow these instructions carefully:  1.  Shower with CHG Soap the night before surgery and the  morning of surgery.  2.  If you choose to wash your  hair, wash your hair first as usual with your normal  shampoo.  3.  After you shampoo, rinse your hair and body thoroughly to remove the shampoo.                             4.  Use CHG as you would any other liquid soap.  You can apply chg directly to the skin and wash.  Gently with a scrungie or clean washcloth.  5.  Apply the CHG Soap to your body ONLY FROM THE NECK DOWN.   Do   not use on face/ open                           Wound or open sores. Avoid contact with eyes, ears mouth and   genitals (private  parts).                       Wash face,  Genitals (private parts) with your normal soap.             6.  Wash thoroughly, paying special attention to the area where your    surgery  will be performed.  7.  Thoroughly rinse your body with warm water from the neck down.  8.  DO NOT shower/wash with your normal soap after using and rinsing off the CHG Soap.                9.  Pat yourself dry with a clean towel.            10.  Wear clean pajamas.            11.  Place clean sheets on your bed the night of your first shower and do not  sleep with pets. Day of Surgery : Do not apply any lotions/deodorants the morning of surgery.  Please wear clean clothes to the hospital/surgery center.  FAILURE TO FOLLOW THESE INSTRUCTIONS MAY RESULT IN THE CANCELLATION OF YOUR SURGERY  PATIENT SIGNATURE_________________________________  NURSE SIGNATURE__________________________________  ________________________________________________________________________

## 2022-12-15 ENCOUNTER — Encounter (HOSPITAL_COMMUNITY)
Admission: RE | Admit: 2022-12-15 | Discharge: 2022-12-15 | Disposition: A | Discharge status: Home / Self Care | Payer: BC Managed Care – PPO | Source: Ambulatory Visit | Attending: General Surgery | Admitting: General Surgery

## 2022-12-15 ENCOUNTER — Encounter (HOSPITAL_COMMUNITY): Payer: Self-pay

## 2022-12-15 ENCOUNTER — Other Ambulatory Visit: Payer: Self-pay

## 2022-12-15 VITALS — BP 131/81 | HR 92 | Temp 97.6°F | Resp 14 | Ht 64.0 in | Wt 144.0 lb

## 2022-12-15 DIAGNOSIS — K449 Diaphragmatic hernia without obstruction or gangrene: Secondary | ICD-10-CM

## 2022-12-15 DIAGNOSIS — R7303 Prediabetes: Secondary | ICD-10-CM | POA: Diagnosis not present

## 2022-12-15 DIAGNOSIS — Z01818 Encounter for other preprocedural examination: Secondary | ICD-10-CM

## 2022-12-15 HISTORY — DX: Other specified postprocedural states: R11.2

## 2022-12-15 HISTORY — DX: Other specified postprocedural states: Z98.890

## 2022-12-15 HISTORY — DX: Prediabetes: R73.03

## 2022-12-15 LAB — CBC WITH DIFFERENTIAL/PLATELET
Abs Immature Granulocytes: 0.07 10*3/uL (ref 0.00–0.07)
Basophils Absolute: 0 10*3/uL (ref 0.0–0.1)
Basophils Relative: 1 %
Eosinophils Absolute: 0 10*3/uL (ref 0.0–0.5)
Eosinophils Relative: 1 %
HCT: 43.4 % (ref 36.0–46.0)
Hemoglobin: 14.6 g/dL (ref 12.0–15.0)
Immature Granulocytes: 1 %
Lymphocytes Relative: 29 %
Lymphs Abs: 1.6 10*3/uL (ref 0.7–4.0)
MCH: 29.5 pg (ref 26.0–34.0)
MCHC: 33.6 g/dL (ref 30.0–36.0)
MCV: 87.7 fL (ref 80.0–100.0)
Monocytes Absolute: 0.5 10*3/uL (ref 0.1–1.0)
Monocytes Relative: 9 %
Neutro Abs: 3.3 10*3/uL (ref 1.7–7.7)
Neutrophils Relative %: 59 %
Platelets: 255 10*3/uL (ref 150–400)
RBC: 4.95 MIL/uL (ref 3.87–5.11)
RDW: 12.3 % (ref 11.5–15.5)
WBC: 5.5 10*3/uL (ref 4.0–10.5)
nRBC: 0 % (ref 0.0–0.2)

## 2022-12-15 LAB — COMPREHENSIVE METABOLIC PANEL
ALT: 26 U/L (ref 0–44)
AST: 20 U/L (ref 15–41)
Albumin: 4.2 g/dL (ref 3.5–5.0)
Alkaline Phosphatase: 83 U/L (ref 38–126)
Anion gap: 10 (ref 5–15)
BUN: 14 mg/dL (ref 6–20)
CO2: 27 mmol/L (ref 22–32)
Calcium: 9.1 mg/dL (ref 8.9–10.3)
Chloride: 102 mmol/L (ref 98–111)
Creatinine, Ser: 0.8 mg/dL (ref 0.44–1.00)
GFR, Estimated: >60 mL/min (ref >60)
Glucose, Bld: 242 mg/dL — ABNORMAL HIGH (ref 70–99)
Potassium: 4 mmol/L (ref 3.5–5.1)
Sodium: 139 mmol/L (ref 135–145)
Total Bilirubin: 0.7 mg/dL (ref 0.3–1.2)
Total Protein: 6.8 g/dL (ref 6.5–8.1)

## 2022-12-15 LAB — GLUCOSE, CAPILLARY: Glucose-Capillary: 243 mg/dL — ABNORMAL HIGH (ref 70–99)

## 2022-12-15 LAB — HEMOGLOBIN A1C
Hgb A1c MFr Bld: 8.5 % — ABNORMAL HIGH (ref 4.8–5.6)
Mean Plasma Glucose: 197.25 mg/dL

## 2022-12-15 LAB — NO BLOOD PRODUCTS

## 2022-12-15 NOTE — Progress Notes (Signed)
A1C 8.5, results routed to Dr. Andrey Campanile

## 2022-12-18 DIAGNOSIS — M1612 Unilateral primary osteoarthritis, left hip: Secondary | ICD-10-CM | POA: Diagnosis not present

## 2022-12-18 DIAGNOSIS — M4120 Other idiopathic scoliosis, site unspecified: Secondary | ICD-10-CM | POA: Diagnosis not present

## 2022-12-18 DIAGNOSIS — M5136 Other intervertebral disc degeneration, lumbar region: Secondary | ICD-10-CM | POA: Diagnosis not present

## 2023-01-06 ENCOUNTER — Ambulatory Visit (HOSPITAL_COMMUNITY): Payer: BC Managed Care – PPO | Admitting: Physician Assistant

## 2023-01-06 ENCOUNTER — Encounter (HOSPITAL_COMMUNITY): Payer: Self-pay | Admitting: General Surgery

## 2023-01-06 ENCOUNTER — Encounter (HOSPITAL_COMMUNITY)
Admission: AD | Disposition: A | Discharge status: Home / Self Care | Payer: Self-pay | Source: Ambulatory Visit | Attending: General Surgery

## 2023-01-06 ENCOUNTER — Other Ambulatory Visit: Payer: Self-pay

## 2023-01-06 ENCOUNTER — Inpatient Hospital Stay (HOSPITAL_COMMUNITY)
Admission: AD | Admit: 2023-01-06 | Discharge: 2023-01-09 | DRG: 328 | Disposition: A | Discharge status: Home / Self Care | Payer: BC Managed Care – PPO | Source: Ambulatory Visit | Attending: General Surgery | Admitting: General Surgery

## 2023-01-06 DIAGNOSIS — E890 Postprocedural hypothyroidism: Secondary | ICD-10-CM | POA: Diagnosis not present

## 2023-01-06 DIAGNOSIS — Z808 Family history of malignant neoplasm of other organs or systems: Secondary | ICD-10-CM | POA: Diagnosis not present

## 2023-01-06 DIAGNOSIS — Z8719 Personal history of other diseases of the digestive system: Secondary | ICD-10-CM | POA: Diagnosis not present

## 2023-01-06 DIAGNOSIS — K5732 Diverticulitis of large intestine without perforation or abscess without bleeding: Secondary | ICD-10-CM | POA: Diagnosis not present

## 2023-01-06 DIAGNOSIS — M1990 Unspecified osteoarthritis, unspecified site: Secondary | ICD-10-CM | POA: Diagnosis present

## 2023-01-06 DIAGNOSIS — R066 Hiccough: Secondary | ICD-10-CM | POA: Diagnosis not present

## 2023-01-06 DIAGNOSIS — Z888 Allergy status to other drugs, medicaments and biological substances status: Secondary | ICD-10-CM

## 2023-01-06 DIAGNOSIS — Z800 Family history of malignant neoplasm of digestive organs: Secondary | ICD-10-CM | POA: Diagnosis not present

## 2023-01-06 DIAGNOSIS — Z85850 Personal history of malignant neoplasm of thyroid: Secondary | ICD-10-CM | POA: Diagnosis not present

## 2023-01-06 DIAGNOSIS — Z825 Family history of asthma and other chronic lower respiratory diseases: Secondary | ICD-10-CM | POA: Diagnosis not present

## 2023-01-06 DIAGNOSIS — Z01818 Encounter for other preprocedural examination: Secondary | ICD-10-CM

## 2023-01-06 DIAGNOSIS — R609 Edema, unspecified: Secondary | ICD-10-CM | POA: Diagnosis not present

## 2023-01-06 DIAGNOSIS — R7303 Prediabetes: Secondary | ICD-10-CM | POA: Diagnosis present

## 2023-01-06 DIAGNOSIS — Z6824 Body mass index (BMI) 24.0-24.9, adult: Secondary | ICD-10-CM | POA: Diagnosis not present

## 2023-01-06 DIAGNOSIS — Z83438 Family history of other disorder of lipoprotein metabolism and other lipidemia: Secondary | ICD-10-CM

## 2023-01-06 DIAGNOSIS — Z8249 Family history of ischemic heart disease and other diseases of the circulatory system: Secondary | ICD-10-CM

## 2023-01-06 DIAGNOSIS — Z8616 Personal history of COVID-19: Secondary | ICD-10-CM | POA: Diagnosis not present

## 2023-01-06 DIAGNOSIS — Z8261 Family history of arthritis: Secondary | ICD-10-CM

## 2023-01-06 DIAGNOSIS — K219 Gastro-esophageal reflux disease without esophagitis: Secondary | ICD-10-CM | POA: Diagnosis present

## 2023-01-06 DIAGNOSIS — J9811 Atelectasis: Secondary | ICD-10-CM | POA: Diagnosis not present

## 2023-01-06 DIAGNOSIS — J90 Pleural effusion, not elsewhere classified: Secondary | ICD-10-CM | POA: Diagnosis not present

## 2023-01-06 DIAGNOSIS — K449 Diaphragmatic hernia without obstruction or gangrene: Secondary | ICD-10-CM | POA: Diagnosis not present

## 2023-01-06 DIAGNOSIS — J439 Emphysema, unspecified: Secondary | ICD-10-CM | POA: Diagnosis not present

## 2023-01-06 DIAGNOSIS — Z823 Family history of stroke: Secondary | ICD-10-CM

## 2023-01-06 DIAGNOSIS — R634 Abnormal weight loss: Secondary | ICD-10-CM | POA: Diagnosis present

## 2023-01-06 DIAGNOSIS — K2289 Other specified disease of esophagus: Secondary | ICD-10-CM | POA: Diagnosis not present

## 2023-01-06 DIAGNOSIS — R1012 Left upper quadrant pain: Secondary | ICD-10-CM | POA: Diagnosis not present

## 2023-01-06 DIAGNOSIS — Z833 Family history of diabetes mellitus: Secondary | ICD-10-CM | POA: Diagnosis not present

## 2023-01-06 DIAGNOSIS — E039 Hypothyroidism, unspecified: Secondary | ICD-10-CM | POA: Diagnosis not present

## 2023-01-06 DIAGNOSIS — K5730 Diverticulosis of large intestine without perforation or abscess without bleeding: Secondary | ICD-10-CM | POA: Diagnosis not present

## 2023-01-06 HISTORY — PX: UPPER GI ENDOSCOPY: SHX6162

## 2023-01-06 HISTORY — PX: XI ROBOTIC ASSISTED PARAESOPHAGEAL HERNIA REPAIR: SHX6871

## 2023-01-06 LAB — CBC
HCT: 42.8 % (ref 36.0–46.0)
Hemoglobin: 13.7 g/dL (ref 12.0–15.0)
MCH: 29 pg (ref 26.0–34.0)
MCHC: 32 g/dL (ref 30.0–36.0)
MCV: 90.5 fL (ref 80.0–100.0)
Platelets: 225 10*3/uL (ref 150–400)
RBC: 4.73 MIL/uL (ref 3.87–5.11)
RDW: 12.3 % (ref 11.5–15.5)
WBC: 9.4 10*3/uL (ref 4.0–10.5)
nRBC: 0 % (ref 0.0–0.2)

## 2023-01-06 LAB — GLUCOSE, CAPILLARY
Glucose-Capillary: 157 mg/dL — ABNORMAL HIGH (ref 70–99)
Glucose-Capillary: 306 mg/dL — ABNORMAL HIGH (ref 70–99)

## 2023-01-06 SURGERY — REPAIR, HERNIA, PARAESOPHAGEAL, ROBOT-ASSISTED
Anesthesia: General

## 2023-01-06 MED ORDER — SCOPOLAMINE 1 MG/3DAYS TD PT72
1.0000 | MEDICATED_PATCH | TRANSDERMAL | Status: DC
  Administered 2023-01-06: 1.5 mg via TRANSDERMAL
  Filled 2023-01-06: qty 1

## 2023-01-06 MED ORDER — FENTANYL CITRATE (PF) 250 MCG/5ML IJ SOLN
INTRAMUSCULAR | Status: AC
  Filled 2023-01-06: qty 5

## 2023-01-06 MED ORDER — PROPOFOL 10 MG/ML IV BOLUS
INTRAVENOUS | Status: AC
  Filled 2023-01-06: qty 20

## 2023-01-06 MED ORDER — ORAL CARE MOUTH RINSE: 15.0000 mL | Freq: Once | OROMUCOSAL | Status: AC

## 2023-01-06 MED ORDER — MIDAZOLAM HCL 2 MG/2ML IJ SOLN
INTRAMUSCULAR | Status: AC
  Filled 2023-01-06: qty 2

## 2023-01-06 MED ORDER — INSULIN ASPART 100 UNIT/ML IJ SOLN
INTRAMUSCULAR | Status: AC
  Filled 2023-01-06: qty 1

## 2023-01-06 MED ORDER — LEVOTHYROXINE SODIUM 75 MCG PO TABS
150.0000 ug | ORAL_TABLET | Freq: Every day | ORAL | Status: DC
  Administered 2023-01-07 – 2023-01-09 (×2): 150 ug via ORAL
  Filled 2023-01-06: qty 2
  Filled 2023-01-06: qty 1
  Filled 2023-01-06: qty 2

## 2023-01-06 MED ORDER — BUPIVACAINE LIPOSOME 1.3 % IJ SUSP
INTRAMUSCULAR | Status: AC
  Filled 2023-01-06: qty 20

## 2023-01-06 MED ORDER — SUGAMMADEX SODIUM 200 MG/2ML IV SOLN
INTRAVENOUS | Status: DC | PRN
  Administered 2023-01-06: 150 mg via INTRAVENOUS

## 2023-01-06 MED ORDER — LACTATED RINGERS IR SOLN
Status: DC | PRN
  Administered 2023-01-06: 1000 mL

## 2023-01-06 MED ORDER — SODIUM CHLORIDE 0.9 % IV SOLN: 12.5000 mg | Freq: Three times a day (TID) | INTRAVENOUS | Status: DC | PRN

## 2023-01-06 MED ORDER — KCL IN DEXTROSE-NACL 20-5-0.45 MEQ/L-%-% IV SOLN
INTRAVENOUS | Status: DC
  Filled 2023-01-06 (×6): qty 1000

## 2023-01-06 MED ORDER — MELATONIN 3 MG PO TABS: 3.0000 mg | ORAL_TABLET | Freq: Every evening | ORAL | Status: DC | PRN

## 2023-01-06 MED ORDER — PHENYLEPHRINE 80 MCG/ML (10ML) SYRINGE FOR IV PUSH (FOR BLOOD PRESSURE SUPPORT)
PREFILLED_SYRINGE | INTRAVENOUS | Status: DC | PRN
  Administered 2023-01-06 (×2): 80 ug via INTRAVENOUS

## 2023-01-06 MED ORDER — DIPHENHYDRAMINE HCL 50 MG/ML IJ SOLN: 12.5000 mg | Freq: Four times a day (QID) | INTRAMUSCULAR | Status: DC | PRN

## 2023-01-06 MED ORDER — PROPOFOL 1000 MG/100ML IV EMUL
INTRAVENOUS | Status: AC
  Filled 2023-01-06: qty 100

## 2023-01-06 MED ORDER — BUPIVACAINE LIPOSOME 1.3 % IJ SUSP: 20.0000 mL | Freq: Once | INTRAMUSCULAR | Status: DC

## 2023-01-06 MED ORDER — CHLORHEXIDINE GLUCONATE CLOTH 2 % EX PADS: 6.0000 | MEDICATED_PAD | Freq: Once | CUTANEOUS | Status: DC

## 2023-01-06 MED ORDER — FENTANYL CITRATE PF 50 MCG/ML IJ SOSY: 25.0000 ug | PREFILLED_SYRINGE | INTRAMUSCULAR | Status: DC | PRN

## 2023-01-06 MED ORDER — LACTATED RINGERS IV SOLN: INTRAVENOUS | Status: DC | PRN

## 2023-01-06 MED ORDER — DEXAMETHASONE SODIUM PHOSPHATE 10 MG/ML IJ SOLN
INTRAMUSCULAR | Status: AC
  Filled 2023-01-06: qty 1

## 2023-01-06 MED ORDER — BUPIVACAINE HCL (PF) 0.25 % IJ SOLN
INTRAMUSCULAR | Status: DC | PRN
  Administered 2023-01-06: 30 mL

## 2023-01-06 MED ORDER — LIDOCAINE 2% (20 MG/ML) 5 ML SYRINGE
INTRAMUSCULAR | Status: DC | PRN
  Administered 2023-01-06: 60 mg via INTRAVENOUS
  Administered 2023-01-06: 1.5 mg/kg/h via INTRAVENOUS

## 2023-01-06 MED ORDER — OXYCODONE HCL 5 MG PO TABS
5.0000 mg | ORAL_TABLET | ORAL | Status: DC | PRN
  Administered 2023-01-06 – 2023-01-09 (×6): 10 mg via ORAL
  Filled 2023-01-06 (×7): qty 2
  Filled 2023-01-06: qty 1

## 2023-01-06 MED ORDER — SIMETHICONE 80 MG PO CHEW: 40.0000 mg | CHEWABLE_TABLET | Freq: Four times a day (QID) | ORAL | Status: DC | PRN

## 2023-01-06 MED ORDER — FENTANYL CITRATE (PF) 250 MCG/5ML IJ SOLN
INTRAMUSCULAR | Status: DC | PRN
  Administered 2023-01-06 (×2): 50 ug via INTRAVENOUS
  Administered 2023-01-06: 100 ug via INTRAVENOUS
  Administered 2023-01-06: 50 ug via INTRAVENOUS
  Administered 2023-01-06: 25 ug via INTRAVENOUS

## 2023-01-06 MED ORDER — FENTANYL CITRATE (PF) 100 MCG/2ML IJ SOLN
INTRAMUSCULAR | Status: AC
  Filled 2023-01-06: qty 2

## 2023-01-06 MED ORDER — ONDANSETRON HCL 4 MG/2ML IJ SOLN
INTRAMUSCULAR | Status: AC
  Filled 2023-01-06: qty 2

## 2023-01-06 MED ORDER — CHLORHEXIDINE GLUCONATE 0.12 % MT SOLN
15.0000 mL | Freq: Once | OROMUCOSAL | Status: AC
  Administered 2023-01-06: 15 mL via OROMUCOSAL

## 2023-01-06 MED ORDER — PHENOL 1.4 % MT LIQD: 1.0000 | OROMUCOSAL | Status: DC | PRN

## 2023-01-06 MED ORDER — 0.9 % SODIUM CHLORIDE (POUR BTL) OPTIME
TOPICAL | Status: DC | PRN
  Administered 2023-01-06: 1000 mL

## 2023-01-06 MED ORDER — PROPOFOL 500 MG/50ML IV EMUL
INTRAVENOUS | Status: DC | PRN
  Administered 2023-01-06: 150 ug/kg/min via INTRAVENOUS

## 2023-01-06 MED ORDER — PROPOFOL 10 MG/ML IV BOLUS
INTRAVENOUS | Status: DC | PRN
  Administered 2023-01-06: 150 mg via INTRAVENOUS

## 2023-01-06 MED ORDER — PANTOPRAZOLE SODIUM 40 MG IV SOLR
40.0000 mg | Freq: Every day | INTRAVENOUS | Status: DC
  Administered 2023-01-06 – 2023-01-08 (×3): 40 mg via INTRAVENOUS
  Filled 2023-01-06 (×3): qty 10

## 2023-01-06 MED ORDER — ACETAMINOPHEN 500 MG PO TABS
1000.0000 mg | ORAL_TABLET | Freq: Four times a day (QID) | ORAL | Status: DC
  Administered 2023-01-06 – 2023-01-07 (×4): 1000 mg via ORAL
  Filled 2023-01-06 (×5): qty 2

## 2023-01-06 MED ORDER — LIDOCAINE HCL 2 % IJ SOLN
INTRAMUSCULAR | Status: AC
  Filled 2023-01-06: qty 20

## 2023-01-06 MED ORDER — BUPIVACAINE HCL 0.25 % IJ SOLN
INTRAMUSCULAR | Status: AC
  Filled 2023-01-06: qty 1

## 2023-01-06 MED ORDER — DEXAMETHASONE SODIUM PHOSPHATE 10 MG/ML IJ SOLN
INTRAMUSCULAR | Status: DC | PRN
  Administered 2023-01-06: 6 mg via INTRAVENOUS

## 2023-01-06 MED ORDER — KETOROLAC TROMETHAMINE 15 MG/ML IJ SOLN
15.0000 mg | Freq: Three times a day (TID) | INTRAMUSCULAR | Status: DC | PRN
  Administered 2023-01-07 – 2023-01-09 (×3): 15 mg via INTRAVENOUS
  Filled 2023-01-06 (×3): qty 1

## 2023-01-06 MED ORDER — CEFAZOLIN SODIUM-DEXTROSE 2-4 GM/100ML-% IV SOLN
2.0000 g | INTRAVENOUS | Status: AC
  Administered 2023-01-06: 2 g via INTRAVENOUS
  Filled 2023-01-06: qty 100

## 2023-01-06 MED ORDER — ONDANSETRON 4 MG PO TBDP: 4.0000 mg | ORAL_TABLET | Freq: Four times a day (QID) | ORAL | Status: DC | PRN

## 2023-01-06 MED ORDER — MORPHINE SULFATE (PF) 2 MG/ML IV SOLN
1.0000 mg | INTRAVENOUS | Status: DC | PRN
  Administered 2023-01-07 – 2023-01-08 (×4): 2 mg via INTRAVENOUS
  Filled 2023-01-06 (×6): qty 1

## 2023-01-06 MED ORDER — ACETAMINOPHEN 500 MG PO TABS
1000.0000 mg | ORAL_TABLET | ORAL | Status: AC
  Administered 2023-01-06: 1000 mg via ORAL
  Filled 2023-01-06: qty 2

## 2023-01-06 MED ORDER — DEXAMETHASONE SODIUM PHOSPHATE 4 MG/ML IJ SOLN: 4.0000 mg | INTRAMUSCULAR | Status: DC

## 2023-01-06 MED ORDER — DOCUSATE SODIUM 100 MG PO CAPS
100.0000 mg | ORAL_CAPSULE | Freq: Two times a day (BID) | ORAL | Status: DC
  Administered 2023-01-06 (×2): 100 mg via ORAL
  Filled 2023-01-06 (×2): qty 1

## 2023-01-06 MED ORDER — LABETALOL HCL 5 MG/ML IV SOLN
INTRAVENOUS | Status: DC | PRN
  Administered 2023-01-06 (×2): 2.5 mg via INTRAVENOUS

## 2023-01-06 MED ORDER — DIPHENHYDRAMINE HCL 12.5 MG/5ML PO ELIX: 12.5000 mg | ORAL_SOLUTION | Freq: Four times a day (QID) | ORAL | Status: DC | PRN

## 2023-01-06 MED ORDER — ROCURONIUM BROMIDE 10 MG/ML (PF) SYRINGE
PREFILLED_SYRINGE | INTRAVENOUS | Status: AC
  Filled 2023-01-06: qty 10

## 2023-01-06 MED ORDER — LACTATED RINGERS IV SOLN: INTRAVENOUS | Status: DC

## 2023-01-06 MED ORDER — BUPIVACAINE LIPOSOME 1.3 % IJ SUSP
INTRAMUSCULAR | Status: DC | PRN
  Administered 2023-01-06: 20 mL

## 2023-01-06 MED ORDER — PHENYLEPHRINE 80 MCG/ML (10ML) SYRINGE FOR IV PUSH (FOR BLOOD PRESSURE SUPPORT)
PREFILLED_SYRINGE | INTRAVENOUS | Status: AC
  Filled 2023-01-06: qty 10

## 2023-01-06 MED ORDER — INSULIN ASPART 100 UNIT/ML IJ SOLN
10.0000 [IU] | Freq: Once | INTRAMUSCULAR | Status: AC
  Administered 2023-01-06: 10 [IU] via SUBCUTANEOUS

## 2023-01-06 MED ORDER — ENOXAPARIN SODIUM 40 MG/0.4ML IJ SOSY
40.0000 mg | PREFILLED_SYRINGE | INTRAMUSCULAR | Status: DC
  Administered 2023-01-07: 40 mg via SUBCUTANEOUS
  Filled 2023-01-06: qty 0.4

## 2023-01-06 MED ORDER — LABETALOL HCL 5 MG/ML IV SOLN
INTRAVENOUS | Status: AC
  Filled 2023-01-06: qty 4

## 2023-01-06 MED ORDER — MIDAZOLAM HCL 2 MG/2ML IJ SOLN
INTRAMUSCULAR | Status: DC | PRN
  Administered 2023-01-06: 2 mg via INTRAVENOUS

## 2023-01-06 MED ORDER — AMISULPRIDE (ANTIEMETIC) 5 MG/2ML IV SOLN: 10.0000 mg | Freq: Once | INTRAVENOUS | Status: DC | PRN

## 2023-01-06 MED ORDER — ONDANSETRON HCL 4 MG/2ML IJ SOLN
4.0000 mg | Freq: Four times a day (QID) | INTRAMUSCULAR | Status: DC | PRN
  Administered 2023-01-07: 4 mg via INTRAVENOUS
  Administered 2023-01-07: 5 mg via INTRAVENOUS
  Filled 2023-01-06 (×2): qty 2

## 2023-01-06 MED ORDER — HEPARIN SODIUM (PORCINE) 5000 UNIT/ML IJ SOLN
5000.0000 [IU] | Freq: Once | INTRAMUSCULAR | Status: AC
  Administered 2023-01-06: 5000 [IU] via SUBCUTANEOUS
  Filled 2023-01-06: qty 1

## 2023-01-06 MED ORDER — LIDOCAINE HCL (PF) 2 % IJ SOLN
INTRAMUSCULAR | Status: AC
  Filled 2023-01-06: qty 5

## 2023-01-06 MED ORDER — ROCURONIUM BROMIDE 10 MG/ML (PF) SYRINGE
PREFILLED_SYRINGE | INTRAVENOUS | Status: DC | PRN
  Administered 2023-01-06: 50 mg via INTRAVENOUS
  Administered 2023-01-06 (×3): 10 mg via INTRAVENOUS
  Administered 2023-01-06: 20 mg via INTRAVENOUS
  Administered 2023-01-06: 30 mg via INTRAVENOUS

## 2023-01-06 MED ORDER — METHOCARBAMOL 500 MG PO TABS: 500.0000 mg | ORAL_TABLET | Freq: Four times a day (QID) | ORAL | Status: DC | PRN

## 2023-01-06 MED ORDER — ONDANSETRON HCL 4 MG/2ML IJ SOLN
INTRAMUSCULAR | Status: DC | PRN
  Administered 2023-01-06: 4 mg via INTRAVENOUS

## 2023-01-06 SURGICAL SUPPLY — 68 items
ADH SKN CLS APL DERMABOND .7 (GAUZE/BANDAGES/DRESSINGS)
ANTIFOG SOL W/FOAM PAD STRL (MISCELLANEOUS) ×1
APL PRP STRL LF DISP 70% ISPRP (MISCELLANEOUS) ×1
APPLIER CLIP 5 13 M/L LIGAMAX5 (MISCELLANEOUS)
APPLIER CLIP ROT 10 11.4 M/L (STAPLE)
APR CLP MED LRG 11.4X10 (STAPLE)
APR CLP MED LRG 5 ANG JAW (MISCELLANEOUS)
BLADE SURG SZ11 CARB STEEL (BLADE) ×1 IMPLANT
CHLORAPREP W/TINT 26 (MISCELLANEOUS) ×1 IMPLANT
CLAMP ENDO BABCK 10MM (STAPLE) IMPLANT
CLIP APPLIE 5 13 M/L LIGAMAX5 (MISCELLANEOUS) IMPLANT
CLIP APPLIE ROT 10 11.4 M/L (STAPLE) IMPLANT
COVER SURGICAL LIGHT HANDLE (MISCELLANEOUS) ×1 IMPLANT
COVER TIP SHEARS 8 DVNC (MISCELLANEOUS) IMPLANT
DERMABOND ADVANCED .7 DNX12 (GAUZE/BANDAGES/DRESSINGS) IMPLANT
DRAIN PENROSE 0.5X18 (DRAIN) IMPLANT
DRAPE ARM DVNC X/XI (DISPOSABLE) ×4 IMPLANT
DRAPE COLUMN DVNC XI (DISPOSABLE) ×1 IMPLANT
DRIVER NDL LRG 8 DVNC XI (INSTRUMENTS) ×1 IMPLANT
DRIVER NDL MEGA SUTCUT DVNCXI (INSTRUMENTS) ×1 IMPLANT
DRIVER NDLE LRG 8 DVNC XI (INSTRUMENTS) ×1 IMPLANT
DRIVER NDLE MEGA SUTCUT DVNCXI (INSTRUMENTS) ×1 IMPLANT
DRSG TEGADERM 2-3/8X2-3/4 SM (GAUZE/BANDAGES/DRESSINGS) ×6 IMPLANT
ELECT REM PT RETURN 15FT ADLT (MISCELLANEOUS) ×1 IMPLANT
GAUZE 4X4 16PLY ~~LOC~~+RFID DBL (SPONGE) ×1 IMPLANT
GAUZE SPONGE 2X2 8PLY STRL LF (GAUZE/BANDAGES/DRESSINGS) ×1 IMPLANT
GLOVE BIO SURGEON STRL SZ7.5 (GLOVE) ×2 IMPLANT
GLOVE INDICATOR 8.0 STRL GRN (GLOVE) ×2 IMPLANT
GOWN STRL REUS W/ TWL XL LVL3 (GOWN DISPOSABLE) IMPLANT
GOWN STRL REUS W/TWL XL LVL3 (GOWN DISPOSABLE)
GRASPER SUT TROCAR 14GX15 (MISCELLANEOUS) IMPLANT
GRASPER TIP-UP FEN DVNC XI (INSTRUMENTS) ×1 IMPLANT
IRRIG SUCT STRYKERFLOW 2 WTIP (MISCELLANEOUS) ×1
IRRIGATION SUCT STRKRFLW 2 WTP (MISCELLANEOUS) ×1 IMPLANT
KIT BASIN OR (CUSTOM PROCEDURE TRAY) ×1 IMPLANT
KIT GASTRIC LAVAGE 34FR ADT (SET/KITS/TRAYS/PACK) IMPLANT
KIT TURNOVER KIT A (KITS) IMPLANT
LUBRICANT JELLY K Y 4OZ (MISCELLANEOUS) IMPLANT
MARKER SKIN DUAL TIP RULER LAB (MISCELLANEOUS) IMPLANT
NDL HYPO 22X1.5 SAFETY MO (MISCELLANEOUS) ×1 IMPLANT
NEEDLE HYPO 22X1.5 SAFETY MO (MISCELLANEOUS) ×1 IMPLANT
PACK CARDIOVASCULAR III (CUSTOM PROCEDURE TRAY) ×1 IMPLANT
PAD POSITIONING PINK XL (MISCELLANEOUS) ×1 IMPLANT
SCISSORS LAP 5X35 DISP (ENDOMECHANICALS) IMPLANT
SCISSORS MNPLR CVD DVNC XI (INSTRUMENTS) ×1 IMPLANT
SEAL UNIV 5-12 XI (MISCELLANEOUS) ×3 IMPLANT
SEALER VESSEL EXT DVNC XI (MISCELLANEOUS) ×1 IMPLANT
SOL ELECTROSURG ANTI STICK (MISCELLANEOUS) ×1
SOLUTION ANTFG W/FOAM PAD STRL (MISCELLANEOUS) ×1 IMPLANT
SOLUTION ELECTROSURG ANTI STCK (MISCELLANEOUS) ×1 IMPLANT
SPIKE FLUID TRANSFER (MISCELLANEOUS) ×1 IMPLANT
STRIP CLOSURE SKIN 1/2X4 (GAUZE/BANDAGES/DRESSINGS) ×1 IMPLANT
SUT ETHIBOND 0 36 GRN (SUTURE) ×3 IMPLANT
SUT MNCRL AB 4-0 PS2 18 (SUTURE) ×1 IMPLANT
SUT SILK 0 SH 30 (SUTURE) IMPLANT
SUT SILK 2 0 SH (SUTURE) IMPLANT
SUT VIC AB 0 UR5 27 (SUTURE) IMPLANT
SYR 20ML ECCENTRIC (SYRINGE) ×1 IMPLANT
SYR 20ML LL LF (SYRINGE) ×1 IMPLANT
TIP INNERVISION DETACH 40FR (MISCELLANEOUS) IMPLANT
TIP INNERVISION DETACH 50FR (MISCELLANEOUS) IMPLANT
TIP INNERVISION DETACH 56FR (MISCELLANEOUS) IMPLANT
TOWEL OR 17X26 10 PK STRL BLUE (TOWEL DISPOSABLE) ×1 IMPLANT
TRAY FOLEY MTR SLVR 16FR STAT (SET/KITS/TRAYS/PACK) IMPLANT
TROCAR ADV FIXATION 12X100MM (TROCAR) IMPLANT
TROCAR ADV FIXATION 5X100MM (TROCAR) IMPLANT
TROCAR XCEL NON-BLD 5MMX100MML (ENDOMECHANICALS) ×1 IMPLANT
TUBING INSUFFLATION 10FT LAP (TUBING) ×1 IMPLANT

## 2023-01-06 NOTE — Transfer of Care (Signed)
Immediate Anesthesia Transfer of Care Note  [name]  Procedure(s) Performed: XI ROBOTIC ASSISTED PARAESOPHAGEAL HIATAL HERNIA REPAIR WITH partial WRAP UPPER GI ENDOSCOPY  Patient Location: PACU  Anesthesia Type:General  Level of Consciousness: drowsy  Airway & Oxygen Therapy: Patient Spontanous Breathing and Patient connected to face mask oxygen  Post-op Assessment: Report given to RN and Post -op Vital signs reviewed and stable  Post vital signs: Reviewed and stable  Last Vitals:  Vitals Value Taken Time  BP 141/90 01/06/23 1131  Temp    Pulse 94 01/06/23 1132  Resp 12 01/06/23 1132  SpO2 100 % 01/06/23 1132  Vitals shown include unvalidated device data.  Last Pain:  Vitals:   01/06/23 0606  TempSrc: Oral  PainSc:          Complications:  Encounter Notable Events  Notable Event Outcome Phase Comment  Difficult to intubate - expected  Intraprocedure Filed from anesthesia note documentation.

## 2023-01-06 NOTE — Anesthesia Preprocedure Evaluation (Addendum)
Anesthesia Evaluation  Patient identified by MRN, date of birth, ID band Patient awake    Reviewed: Allergy & Precautions, NPO status , Patient's Chart, lab work & pertinent test results  History of Anesthesia Complications (+) PONV and history of anesthetic complications  Airway Mallampati: III  TM Distance: >3 FB Neck ROM: Full    Dental no notable dental hx.    Pulmonary neg pulmonary ROS   Pulmonary exam normal        Cardiovascular negative cardio ROS  Rhythm:Regular Rate:Normal     Neuro/Psych negative neurological ROS  negative psych ROS   GI/Hepatic Neg liver ROS,GERD  Medicated,,  Endo/Other  Hypothyroidism    Renal/GU   negative genitourinary   Musculoskeletal  (+) Arthritis , Osteoarthritis,    Abdominal Normal abdominal exam  (+)   Peds  Hematology negative hematology ROS (+)   Anesthesia Other Findings   Reproductive/Obstetrics                             Anesthesia Physical Anesthesia Plan  ASA: 3  Anesthesia Plan: General   Post-op Pain Management: Tylenol PO (pre-op)*   Induction: Intravenous  PONV Risk Score and Plan: 4 or greater and Ondansetron, Dexamethasone, Midazolam, Scopolamine patch - Pre-op, Treatment may vary due to age or medical condition, Amisulpride and TIVA  Airway Management Planned: Mask, Oral ETT and Video Laryngoscope Planned  Additional Equipment: None  Intra-op Plan:   Post-operative Plan: Extubation in OR  Informed Consent: I have reviewed the patients History and Physical, chart, labs and discussed the procedure including the risks, benefits and alternatives for the proposed anesthesia with the patient or authorized representative who has indicated his/her understanding and acceptance.     Dental advisory given  Plan Discussed with: CRNA  Anesthesia Plan Comments: (Lab Results      Component                Value                Date                      WBC                      5.5                 12/15/2022                HGB                      14.6                12/15/2022                HCT                      43.4                12/15/2022                MCV                      87.7                12/15/2022                PLT  255                 12/15/2022           )       Anesthesia Quick Evaluation

## 2023-01-06 NOTE — Op Note (Signed)
01/06/2023  11:58 AM  PATIENT:  Sierra Perkins  57 y.o. female  PRE-OPERATIVE DIAGNOSIS:  LARGE PARAESOPHAGEAL HERNIA  POST-OPERATIVE DIAGNOSIS:  LARGE PARAESOPHAGEAL HERNIA  PROCEDURE:  Procedure(s): XI ROBOTIC ASSISTED PARAESOPHAGEAL HIATAL HERNIA REPAIR WITH partial WRAP UPPER GI ENDOSCOPY LAPAROSCOPIC TAP BLOCK  SURGEON:  Surgeon(s): Gaynelle Adu, MD   ASSISTANTS: Luretha Murphy, MD   ANESTHESIA:   general  DRAINS: none   LOCAL MEDICATIONS USED:  BUPIVICAINE  and OTHER EXPAREL  SPECIMEN:  Source of Specimen:  HERNIA SAC  DISPOSITION OF SPECIMEN:   DISCARDED  COUNTS:  YES  INDICATION FOR PROCEDURE: Has known about her hiatal hernia for many years but she started having some pain in her rib cage area as well as left upper quadrant left chest wall discomfort.  She underwent a CT scan which showed a large paraesophageal hernia containing a portion of the stomach as well as the pancreas.  She reported an occasional stuck sensation if she ate something rapidly or if it was really dry.  She also had discomfort in her left lower rib cage area if she bends over.  She also would get full on small meals.  She denies any regurgitation or reflux.  She underwent upper endoscopy as well as upper GI please see those reports for additional information.  There was evidence of some dysmotility on her upper GI so we talked about only doing a partial fundoplication.  Patient is a TEFL teacher Witness and would not accept blood products but would except albumin products  PROCEDURE: Patient received subcutaneous heparin, Tylenol and Decadron preoperatively.  She was then taken to operating room to at [facility] and placed supine on the operative table after form consent was obtained.  General endotracheal anesthesia was established.  Her arms were tucked at her side with the appropriate padding.  Sequential compression devices were placed.  Her abdomen was prepped and draped in the usual  standard surgical fashion with ChloraPrep.  IV antibiotic was administered.  Surgical timeout was performed.  Access to her abdomen was gained in the left mid abdomen to the left of the umbilicus about 8 cm.  A 0 degree 5 mm laparoscope was advanced through a 5 mm trocar through all layers of the abdominal wall and carefully entered the abdominal cavity.  Pneumoperitoneum was smoothly established and the abdominal cavity was surveilled.  There is no evidence of injury to surrounding viscera.  The patient was placed in reverse Trendelenburg.  I then performed a bilateral laparoscopic tap block for postoperative pain relief.  We then placed an 8 mm robotic trocar at the level of the umbilicus 1 to the right of the umbilicus about 8 cm an assistant 12 mm trocar in the right lateral abdominal wall.  The optical entry trocar was replaced with an 8 mm robotic trocar and a final 8 mm robotic trocar was placed in the left lateral abdominal wall all under direct visualization.  A Nathanson liver retractor was placed to lift up the left lobe of the liver exposing the hiatus.  There is a large hiatal hernia defect.  The [employer] robot was then docked.  The fenestrated bipolar in arm 1, camera in arm 2, vessel sealer in arm 3 and tip up grasper in arm 4.  My assistant stayed at the bedside when I went to the surgeon console.  We first carefully incised the gastrohepatic ligament with vessel sealer.  I then started taking down the hernia sac along the  right crus and continued anteriorly.  We mobilized the hernia sac out of the mediastinum anteriorly toward the left crus of the diaphragm and came down along the left crus of the diaphragm preserving the peritoneum over the left crura.  I then returned to the right side and commence the mediastinal mobilization and dissection with careful gentle blunt dissection along with vessel sealer.  The stomach was in the mediastinum probably about one third of the stomach.  Careful  gentle sweeping motions were performed.  The aorta was identified.  I was able to reduce the entire hernia sac both anterior and posteriorly back into the abdomen.  The confluence of the left and right diaphragmatic crura were identified taking care to make sure that we were not near the pancreas since part of it was herniated into the mediastinum.  I was able to create a retrogastric window along the diaphragmatic crura.  My assistant introduced a Penrose drain and we encircled the proximal stomach allowing [place] to work more in the mediastinum.  My assistant provided retraction.  I then continued with a circumferential mobilization of the esophagus in the mediastinum.  Interestingly I did not visualize the vagus nerves but I did not come across anything that looked like a vagus nerve.  We continued a circumferential mobilization of the esophagus.  She had more stomach stuck posteriorly in the lower chest but I was able to mobilize and get that to fall back into the abdominal cavity.  Avascular attachments were taken down between the aorta and the posterior esophagus.  I then went and obtained the Olympus endoscope and did an upper endoscopy.  The endoscope was placed carefully in the patient's oropharynx and guided down.  There is no evidence of injury to the proximal middle or distal esophagus.  The scope was advanced into the stomach and slowly pulled back.  We visualized the Z-line and transilluminated the Z-line was probably still about 1 cm within the mediastinum.  The stomach was desufflated and the endoscope was removed.  I went back to the surgeon console and continued with circumferential mobilization of the esophagus in the mediastinum.  We were probably about 7 to 8 cm into the mediastinum at this point.  Lateral attachments were still taken down.  We did not violate parietal pleura.  Again did not come across anything that looked like vagus nerves.  At this point we had achieved complete reduction of the  stomach back into the abdominal cavity along with about 3 cm of esophageal length.  I then debrided and excised some of the hernia sac with vessel sealer in 3 pieces.  I then reapproximated the left and right crura with 7 interrupted 0 Ethibond sutures it was not under tension.  I had reduced pneumoperitoneum to 10 mmHg for the diaphragmatic closure.  I then reperformed an upper endoscopy to confirm that the Z-line was well below the diaphragmatic repair.  The scope was advanced and again the esophagus was inspected as I went down and there is no evidence of injury to the esophagus.  There is no evidence of leak.  The stomach was insufflated and a retroflex confirming that we had a good hiatal closure and we had a type I Hill grade valve view.  Scope was detorsed and the stomach was desufflated.  We then transilluminated and confirmed that the Z-line was below the diaphragmatic crura probably about about 2-1/2 to 3 cm.  I then performed a partial fundoplication in typical fashion.  3  sutures were placed along the right anterior lateral side of the esophagus to the greater curve of the stomach that had been brought retrogastric.  In a similar fashion 3 sutures were placed along the left side of the esophagus to the stomach.  Prior to performing the sutures we did a shoeshine maneuver to make sure that we had not twisted the stomach that we had brought on the esophagus.  The hernia sac was removed in pieces.  I then did 1 final endoscopy again no evidence of esophageal injury.  The scope was advanced into the stomach and the stomach was inflated I then retroflexed and looked back at the diaphragmatic closure.  The wrap appeared to be intact with a partial wrap as planned.  The stomach was decompressed and the endoscope was removed.  The surgical robot was undocked.  I scrubbed back in.  The 12 mm assistant trocar was removed and the fascial defect was closed with 0 Vicryl using a PMI suture passer.  Local was  infiltrated.  Liver retractor was removed.  Trocars were removed.  Skin incisions were closed with a 4 Monocryl followed by the application of Steri-Strips 2 x 2's and Tegaderms.  All needle, instrument, and sponge counts were correct x 2.  There were no immediate complications.  The patient tolerated the procedure well  PLAN OF CARE: Admit for overnight observation  PATIENT DISPOSITION:  PACU - hemodynamically stable.   Delay start of Pharmacological VTE agent (>24hrs) due to surgical blood loss or risk of bleeding:  no  Mary Sella. Andrey Campanile, MD, FACS General, Bariatric, & Minimally Invasive Surgery [facility], [place]

## 2023-01-06 NOTE — H&P (Signed)
CC: here for surgery  Requesting provider: n/a  HPI: Sierra Perkins is an 57 y.o. female who is here for robotic repair of PEH with possible mesh and [poss wrap; no changes since seen   Sierra Perkins is a 57 y.o. female who is seen today as an office consultation at the request of Dr. Duanne Perkins for evaluation of New Consultation (Large Hiatal Hernia ) .   States that she is known she has had a hiatal hernia for many years but thought it was small. She states that she was actually enrolled in a drug trial many years ago with [place] for reflux and esophagitis and erosions 15 years ago. She has not had any recent upper endoscopy. She states she had a colonoscopy a few years ago with Dr. Audley Perkins. She states that she was having some rib cage pain and points to her left side-almost like a bearhug. She saw an orthopedist who did not think it was musculoskeletal related and sent her to her PCP who ordered a CT scan which showed a large hiatal hernia containing a portion of the stomach as well as pancreas and she was referred here. She states she has an occasional stuck sensation if she eats something rapidly or if it is really dry. She still has some left lower rib cage discomfort at times especially if she bends over. She has 1 bowel movement per day. She has had some unintentional weight loss. She takes famotidine a couple times a week generally food related or if she is drink some alcohol. She denies any heartburn. She does sleep elevated. She states she can only eat small meals because she will get full quickly. She denies any regurgitation or reflux per se. She states that she had COVID back in December and had several days of severe coughing and wonders if that is what caused her hiatal hernia to get larger.   Past Medical History:  Diagnosis Date   Degenerative disk disease    C4-C7   Diverticulosis    Non-functioning kidney    left   PONV (postoperative nausea and vomiting)    Pre-diabetes     Thyroid cancer [facility])    February 2014    Past Surgical History:  Procedure Laterality Date   CESAREAN SECTION     KNEE ARTHROSCOPY Bilateral    LAMINECTOMY     TOTAL THYROIDECTOMY  10/20/2012   DR. BENNETT    Family History  Problem Relation Age of Onset   Hypertension Mother    Arthritis Mother    Diabetes Mother    Heart disease Mother    Heart attack Mother    Rheum arthritis Mother    Heart failure Father    Arthritis Father    Heart disease Father    Emphysema Father    Melanoma Father    Colon cancer Father    Diabetes Sister    Stroke Sister 59   Healthy Brother    Arthritis Brother    CAD Sister    Hyperlipidemia Sister    Heart attack Sister 15   Rheum arthritis Other     Social:  reports that she has never smoked. She has never used smokeless tobacco. She reports current alcohol use of about 7.0 standard drinks of alcohol per week. She reports that she does not use drugs.  Allergies:  Allergies  Allergen Reactions   Citric Acid Rash    Medications: I have reviewed the patient's current medications.   ROS - all  of the below systems have been reviewed with the patient and positives are indicated with bold text General: chills, fever or night sweats Eyes: blurry vision or double vision ENT: epistaxis or sore throat Allergy/Immunology: itchy/watery eyes or nasal congestion Hematologic/Lymphatic: bleeding problems, blood clots or swollen lymph nodes Endocrine: temperature intolerance or unexpected weight changes Breast: new or changing breast lumps or nipple discharge Resp: cough, shortness of breath, or wheezing CV: chest pain or dyspnea on exertion GI: as per HPI GU: dysuria, trouble voiding, or hematuria MSK: joint pain or joint stiffness Neuro: TIA or stroke symptoms Derm: pruritus and skin lesion changes Psych: anxiety and depression  PE Blood pressure (!) 138/94, pulse 76, temperature 98.2 F (36.8 C), temperature source Oral, resp.  rate 16, height 5\' 4"  (1.626 m), weight 63.9 kg, last menstrual period 09/02/2016, SpO2 98 %. Constitutional: NAD; conversant; no deformities Eyes: Moist conjunctiva; no lid lag; anicteric; PERRL Neck: Trachea midline; no thyromegaly Lungs: Normal respiratory effort; no tactile fremitus CV: RRR; no palpable thrills; no pitting edema GI: Abd soft, nt, nd; no palpable hepatosplenomegaly MSK: Normal gait; no clubbing/cyanosis Psychiatric: Appropriate affect; alert and oriented x3 Lymphatic: No palpable cervical or axillary lymphadenopathy Skin:no rash/lesion  Results for orders placed or performed during the hospital encounter of 01/06/23 (from the past 48 hour(s))  Glucose, capillary     Status: Abnormal   Collection Time: 01/06/23  6:02 AM  Result Value Ref Range   Glucose-Capillary 157 (H) 70 - 99 mg/dL    Comment: Glucose reference range applies only to samples taken after fasting for at least 8 hours.    No results found.  Imaging: reviewed  A/P: Sierra Perkins is an 57 y.o. female with PEH  For Robotic repair of PEH with possible wrap, poss mesh ERAS All questions asked and answered IV abx Subcu heparin  Sierra Perkins. Sierra Campanile, MD, FACS General, Bariatric, & Minimally Invasive Surgery [facility]

## 2023-01-06 NOTE — Anesthesia Postprocedure Evaluation (Signed)
Anesthesia Post Note  [name]  Procedure(s) Performed: XI ROBOTIC ASSISTED PARAESOPHAGEAL HIATAL HERNIA REPAIR WITH partial WRAP UPPER GI ENDOSCOPY     Patient location during evaluation: PACU Anesthesia Type: General Level of consciousness: awake and alert Pain management: pain level controlled Vital Signs Assessment: post-procedure vital signs reviewed and stable Respiratory status: spontaneous breathing, nonlabored ventilation, respiratory function stable and patient connected to nasal cannula oxygen Cardiovascular status: blood pressure returned to baseline and stable Postop Assessment: no apparent nausea or vomiting Anesthetic complications: yes   Encounter Notable Events  Notable Event Outcome Phase Comment  Difficult to intubate - expected  Intraprocedure Filed from anesthesia note documentation.    Last Vitals:  Vitals:   01/06/23 1230 01/06/23 1245  BP: (!) 142/88 (!) 141/82  Pulse: 88 81  Resp: 15 13  Temp:    SpO2: 93% 95%    Last Pain:  Vitals:   01/06/23 1230  TempSrc:   PainSc: 2                  [name]

## 2023-01-06 NOTE — Anesthesia Procedure Notes (Signed)
Procedure Name: Intubation Date/Time: 01/06/2023 7:47 AM  Performed by: Florene Route, CRNAPre-anesthesia Checklist: Patient identified, Emergency Drugs available, Suction available and Patient being monitored Patient Re-evaluated:Patient Re-evaluated prior to induction Oxygen Delivery Method: Circle system utilized Preoxygenation: Pre-oxygenation with 100% oxygen Induction Type: IV induction Ventilation: Mask ventilation without difficulty and Oral airway inserted - appropriate to patient size Laryngoscope Size: Glidescope and 3 Grade View: Grade II Tube type: Oral Tube size: 6.5 mm Number of attempts: 1 Airway Equipment and Method: Oral airway, Video-laryngoscopy and Rigid stylet Placement Confirmation: ETT inserted through vocal cords under direct vision, positive ETCO2 and breath sounds checked- equal and bilateral Secured at: 20 cm Tube secured with: Tape Dental Injury: Teeth and Oropharynx as per pre-operative assessment  Difficulty Due To: Difficulty was anticipated, Difficult Airway- due to reduced neck mobility, Difficult Airway- due to anterior larynx and Difficult Airway- due to limited oral opening Future Recommendations: Recommend- induction with short-acting agent, and alternative techniques readily available

## 2023-01-07 ENCOUNTER — Encounter (HOSPITAL_COMMUNITY): Payer: Self-pay | Admitting: General Surgery

## 2023-01-07 ENCOUNTER — Other Ambulatory Visit: Payer: Self-pay

## 2023-01-07 ENCOUNTER — Observation Stay (HOSPITAL_COMMUNITY): Payer: BC Managed Care – PPO

## 2023-01-07 ENCOUNTER — Encounter (HOSPITAL_COMMUNITY)
Admission: AD | Disposition: A | Discharge status: Home / Self Care | Payer: Self-pay | Source: Ambulatory Visit | Attending: General Surgery

## 2023-01-07 ENCOUNTER — Observation Stay (HOSPITAL_COMMUNITY): Payer: BC Managed Care – PPO | Admitting: Certified Registered Nurse Anesthetist

## 2023-01-07 DIAGNOSIS — R1012 Left upper quadrant pain: Secondary | ICD-10-CM | POA: Diagnosis not present

## 2023-01-07 DIAGNOSIS — J90 Pleural effusion, not elsewhere classified: Secondary | ICD-10-CM | POA: Diagnosis not present

## 2023-01-07 DIAGNOSIS — R066 Hiccough: Secondary | ICD-10-CM | POA: Diagnosis not present

## 2023-01-07 DIAGNOSIS — K449 Diaphragmatic hernia without obstruction or gangrene: Secondary | ICD-10-CM | POA: Diagnosis not present

## 2023-01-07 DIAGNOSIS — R609 Edema, unspecified: Secondary | ICD-10-CM | POA: Diagnosis not present

## 2023-01-07 DIAGNOSIS — Z8249 Family history of ischemic heart disease and other diseases of the circulatory system: Secondary | ICD-10-CM | POA: Diagnosis not present

## 2023-01-07 DIAGNOSIS — J9811 Atelectasis: Secondary | ICD-10-CM | POA: Diagnosis not present

## 2023-01-07 DIAGNOSIS — K5730 Diverticulosis of large intestine without perforation or abscess without bleeding: Secondary | ICD-10-CM | POA: Diagnosis not present

## 2023-01-07 DIAGNOSIS — Z6824 Body mass index (BMI) 24.0-24.9, adult: Secondary | ICD-10-CM | POA: Diagnosis not present

## 2023-01-07 DIAGNOSIS — Z8616 Personal history of COVID-19: Secondary | ICD-10-CM | POA: Diagnosis not present

## 2023-01-07 DIAGNOSIS — Z808 Family history of malignant neoplasm of other organs or systems: Secondary | ICD-10-CM | POA: Diagnosis not present

## 2023-01-07 DIAGNOSIS — K219 Gastro-esophageal reflux disease without esophagitis: Secondary | ICD-10-CM | POA: Diagnosis present

## 2023-01-07 DIAGNOSIS — Z833 Family history of diabetes mellitus: Secondary | ICD-10-CM | POA: Diagnosis not present

## 2023-01-07 DIAGNOSIS — E890 Postprocedural hypothyroidism: Secondary | ICD-10-CM | POA: Diagnosis present

## 2023-01-07 DIAGNOSIS — Z8719 Personal history of other diseases of the digestive system: Secondary | ICD-10-CM | POA: Diagnosis not present

## 2023-01-07 DIAGNOSIS — R634 Abnormal weight loss: Secondary | ICD-10-CM | POA: Diagnosis present

## 2023-01-07 DIAGNOSIS — R7303 Prediabetes: Secondary | ICD-10-CM | POA: Diagnosis present

## 2023-01-07 DIAGNOSIS — Z823 Family history of stroke: Secondary | ICD-10-CM | POA: Diagnosis not present

## 2023-01-07 DIAGNOSIS — Z8261 Family history of arthritis: Secondary | ICD-10-CM | POA: Diagnosis not present

## 2023-01-07 DIAGNOSIS — Z85850 Personal history of malignant neoplasm of thyroid: Secondary | ICD-10-CM | POA: Diagnosis not present

## 2023-01-07 DIAGNOSIS — M1990 Unspecified osteoarthritis, unspecified site: Secondary | ICD-10-CM | POA: Diagnosis present

## 2023-01-07 DIAGNOSIS — Z825 Family history of asthma and other chronic lower respiratory diseases: Secondary | ICD-10-CM | POA: Diagnosis not present

## 2023-01-07 DIAGNOSIS — Z83438 Family history of other disorder of lipoprotein metabolism and other lipidemia: Secondary | ICD-10-CM | POA: Diagnosis not present

## 2023-01-07 DIAGNOSIS — J439 Emphysema, unspecified: Secondary | ICD-10-CM | POA: Diagnosis not present

## 2023-01-07 DIAGNOSIS — Z800 Family history of malignant neoplasm of digestive organs: Secondary | ICD-10-CM | POA: Diagnosis not present

## 2023-01-07 DIAGNOSIS — Z888 Allergy status to other drugs, medicaments and biological substances status: Secondary | ICD-10-CM | POA: Diagnosis not present

## 2023-01-07 HISTORY — PX: LAPAROSCOPIC NISSEN FUNDOPLICATION: SHX1932

## 2023-01-07 LAB — BASIC METABOLIC PANEL
Anion gap: 7 (ref 5–15)
BUN: 8 mg/dL (ref 6–20)
CO2: 28 mmol/L (ref 22–32)
Calcium: 8.7 mg/dL — ABNORMAL LOW (ref 8.9–10.3)
Chloride: 104 mmol/L (ref 98–111)
Creatinine, Ser: 0.77 mg/dL (ref 0.44–1.00)
GFR, Estimated: >60 mL/min (ref >60)
Glucose, Bld: 193 mg/dL — ABNORMAL HIGH (ref 70–99)
Potassium: 3.9 mmol/L (ref 3.5–5.1)
Sodium: 139 mmol/L (ref 135–145)

## 2023-01-07 LAB — CBC
HCT: 40.1 % (ref 36.0–46.0)
Hemoglobin: 13.2 g/dL (ref 12.0–15.0)
MCH: 29.7 pg (ref 26.0–34.0)
MCHC: 32.9 g/dL (ref 30.0–36.0)
MCV: 90.1 fL (ref 80.0–100.0)
Platelets: 226 10*3/uL (ref 150–400)
RBC: 4.45 MIL/uL (ref 3.87–5.11)
RDW: 12.2 % (ref 11.5–15.5)
WBC: 10 10*3/uL (ref 4.0–10.5)
nRBC: 0 % (ref 0.0–0.2)

## 2023-01-07 LAB — GLUCOSE, CAPILLARY: Glucose-Capillary: 204 mg/dL — ABNORMAL HIGH (ref 70–99)

## 2023-01-07 SURGERY — FUNDOPLICATION, NISSEN, LAPAROSCOPIC
Anesthesia: General

## 2023-01-07 MED ORDER — CHLORHEXIDINE GLUCONATE 0.12 % MT SOLN: 15.0000 mL | Freq: Once | OROMUCOSAL | Status: DC

## 2023-01-07 MED ORDER — FENTANYL CITRATE PF 50 MCG/ML IJ SOSY
25.0000 ug | PREFILLED_SYRINGE | INTRAMUSCULAR | Status: DC | PRN
  Administered 2023-01-07: 50 ug via INTRAVENOUS

## 2023-01-07 MED ORDER — ROCURONIUM BROMIDE 10 MG/ML (PF) SYRINGE
PREFILLED_SYRINGE | INTRAVENOUS | Status: DC | PRN
  Administered 2023-01-07: 70 mg via INTRAVENOUS

## 2023-01-07 MED ORDER — OXYCODONE HCL 5 MG/5ML PO SOLN: 5.0000 mg | Freq: Once | ORAL | Status: DC | PRN

## 2023-01-07 MED ORDER — LIDOCAINE 2% (20 MG/ML) 5 ML SYRINGE
INTRAMUSCULAR | Status: DC | PRN
  Administered 2023-01-07: 60 mg via INTRAVENOUS

## 2023-01-07 MED ORDER — 0.9 % SODIUM CHLORIDE (POUR BTL) OPTIME
TOPICAL | Status: DC | PRN
  Administered 2023-01-07: 1000 mL

## 2023-01-07 MED ORDER — LACTATED RINGERS IV SOLN: INTRAVENOUS | Status: DC

## 2023-01-07 MED ORDER — IOHEXOL 300 MG/ML  SOLN
100.0000 mL | Freq: Once | INTRAMUSCULAR | Status: AC | PRN
  Administered 2023-01-07: 100 mL via INTRAVENOUS

## 2023-01-07 MED ORDER — FENTANYL CITRATE PF 50 MCG/ML IJ SOSY
PREFILLED_SYRINGE | INTRAMUSCULAR | Status: AC
  Filled 2023-01-07: qty 1

## 2023-01-07 MED ORDER — IOHEXOL 300 MG/ML  SOLN
50.0000 mL | Freq: Once | INTRAMUSCULAR | Status: AC | PRN
  Administered 2023-01-07: 20 mL via ORAL

## 2023-01-07 MED ORDER — SUCCINYLCHOLINE CHLORIDE 200 MG/10ML IV SOSY
PREFILLED_SYRINGE | INTRAVENOUS | Status: AC
  Filled 2023-01-07: qty 10

## 2023-01-07 MED ORDER — SODIUM CHLORIDE (PF) 0.9 % IJ SOLN
INTRAMUSCULAR | Status: AC
  Filled 2023-01-07: qty 50

## 2023-01-07 MED ORDER — LACTATED RINGERS IR SOLN
Status: DC | PRN
  Administered 2023-01-07: 1000 mL

## 2023-01-07 MED ORDER — PROPOFOL 1000 MG/100ML IV EMUL
INTRAVENOUS | Status: AC
  Filled 2023-01-07: qty 100

## 2023-01-07 MED ORDER — PROPOFOL 10 MG/ML IV BOLUS
INTRAVENOUS | Status: DC | PRN
  Administered 2023-01-07: 200 mg via INTRAVENOUS

## 2023-01-07 MED ORDER — MIDAZOLAM HCL 2 MG/2ML IJ SOLN
INTRAMUSCULAR | Status: AC
  Filled 2023-01-07: qty 2

## 2023-01-07 MED ORDER — FENTANYL CITRATE (PF) 100 MCG/2ML IJ SOLN
INTRAMUSCULAR | Status: AC
  Filled 2023-01-07: qty 2

## 2023-01-07 MED ORDER — METHOCARBAMOL 1000 MG/10ML IJ SOLN
500.0000 mg | Freq: Four times a day (QID) | INTRAVENOUS | Status: DC | PRN
  Administered 2023-01-07 – 2023-01-09 (×4): 500 mg via INTRAVENOUS
  Filled 2023-01-07: qty 5
  Filled 2023-01-07: qty 500
  Filled 2023-01-07: qty 5
  Filled 2023-01-07 (×2): qty 500
  Filled 2023-01-07: qty 5

## 2023-01-07 MED ORDER — BUPIVACAINE HCL 0.25 % IJ SOLN
INTRAMUSCULAR | Status: AC
  Filled 2023-01-07: qty 1

## 2023-01-07 MED ORDER — PROMETHAZINE HCL 25 MG/ML IJ SOLN: 6.2500 mg | INTRAMUSCULAR | Status: DC | PRN

## 2023-01-07 MED ORDER — DEXAMETHASONE SODIUM PHOSPHATE 10 MG/ML IJ SOLN
INTRAMUSCULAR | Status: AC
  Filled 2023-01-07: qty 1

## 2023-01-07 MED ORDER — DEXAMETHASONE SODIUM PHOSPHATE 4 MG/ML IJ SOLN
INTRAMUSCULAR | Status: DC | PRN
  Administered 2023-01-07: 5 mg via INTRAVENOUS

## 2023-01-07 MED ORDER — BUPIVACAINE LIPOSOME 1.3 % IJ SUSP
INTRAMUSCULAR | Status: AC
  Filled 2023-01-07: qty 20

## 2023-01-07 MED ORDER — ROCURONIUM BROMIDE 10 MG/ML (PF) SYRINGE
PREFILLED_SYRINGE | INTRAVENOUS | Status: AC
  Filled 2023-01-07: qty 10

## 2023-01-07 MED ORDER — AMISULPRIDE (ANTIEMETIC) 5 MG/2ML IV SOLN: 10.0000 mg | Freq: Once | INTRAVENOUS | Status: DC | PRN

## 2023-01-07 MED ORDER — ENOXAPARIN SODIUM 40 MG/0.4ML IJ SOSY
40.0000 mg | PREFILLED_SYRINGE | INTRAMUSCULAR | Status: DC
  Administered 2023-01-08: 40 mg via SUBCUTANEOUS
  Filled 2023-01-07: qty 0.4

## 2023-01-07 MED ORDER — LIDOCAINE HCL (PF) 2 % IJ SOLN
INTRAMUSCULAR | Status: AC
  Filled 2023-01-07: qty 5

## 2023-01-07 MED ORDER — FENTANYL CITRATE (PF) 100 MCG/2ML IJ SOLN
INTRAMUSCULAR | Status: DC | PRN
  Administered 2023-01-07 (×2): 50 ug via INTRAVENOUS
  Administered 2023-01-07: 100 ug via INTRAVENOUS

## 2023-01-07 MED ORDER — ONDANSETRON HCL 4 MG/2ML IJ SOLN
INTRAMUSCULAR | Status: AC
  Filled 2023-01-07: qty 2

## 2023-01-07 MED ORDER — CEFAZOLIN SODIUM-DEXTROSE 2-4 GM/100ML-% IV SOLN
2.0000 g | Freq: Once | INTRAVENOUS | Status: AC
  Administered 2023-01-07: 2 g via INTRAVENOUS

## 2023-01-07 MED ORDER — PROPOFOL 500 MG/50ML IV EMUL
INTRAVENOUS | Status: DC | PRN
  Administered 2023-01-07: 150 ug/kg/min via INTRAVENOUS

## 2023-01-07 MED ORDER — MIDAZOLAM HCL 5 MG/5ML IJ SOLN
INTRAMUSCULAR | Status: DC | PRN
  Administered 2023-01-07: 2 mg via INTRAVENOUS

## 2023-01-07 MED ORDER — ORAL CARE MOUTH RINSE: 15.0000 mL | Freq: Once | OROMUCOSAL | Status: DC

## 2023-01-07 MED ORDER — OXYCODONE HCL 5 MG PO TABS: 5.0000 mg | ORAL_TABLET | Freq: Once | ORAL | Status: DC | PRN

## 2023-01-07 MED ORDER — SUCCINYLCHOLINE CHLORIDE 200 MG/10ML IV SOSY
PREFILLED_SYRINGE | INTRAVENOUS | Status: DC | PRN
  Administered 2023-01-07: 60 mg via INTRAVENOUS

## 2023-01-07 MED ORDER — CEFAZOLIN SODIUM-DEXTROSE 2-4 GM/100ML-% IV SOLN
INTRAVENOUS | Status: AC
  Filled 2023-01-07: qty 100

## 2023-01-07 MED ORDER — KETOROLAC TROMETHAMINE 30 MG/ML IJ SOLN: 30.0000 mg | Freq: Once | INTRAMUSCULAR | Status: DC | PRN

## 2023-01-07 MED ORDER — PROPOFOL 500 MG/50ML IV EMUL
INTRAVENOUS | Status: AC
  Filled 2023-01-07: qty 50

## 2023-01-07 MED ORDER — BUPIVACAINE HCL (PF) 0.25 % IJ SOLN
INTRAMUSCULAR | Status: DC | PRN
  Administered 2023-01-07: 25 mL

## 2023-01-07 SURGICAL SUPPLY — 68 items
ANTIFOG SOL W/FOAM PAD STRL (MISCELLANEOUS) ×1
APL PRP STRL LF DISP 70% ISPRP (MISCELLANEOUS) ×1
APPLIER CLIP ROT 10 11.4 M/L (STAPLE)
APR CLP MED LRG 11.4X10 (STAPLE)
BAG COUNTER SPONGE SURGICOUNT (BAG) IMPLANT
BAG SPNG CNTER NS LX DISP (BAG)
BLADE SURG SZ11 CARB STEEL (BLADE) ×1 IMPLANT
CABLE HIGH FREQUENCY MONO STRZ (ELECTRODE) IMPLANT
CHLORAPREP W/TINT 26 (MISCELLANEOUS) ×1 IMPLANT
CLAMP ENDO BABCK 10MM (STAPLE) IMPLANT
CLIP APPLIE ROT 10 11.4 M/L (STAPLE) IMPLANT
DEVICE SUT QUICK LOAD TK 5 (SUTURE) ×1 IMPLANT
DEVICE SUT TI-KNOT TK 5X26 (SUTURE) ×1 IMPLANT
DEVICE SUTURE ENDOST 10MM (ENDOMECHANICALS) ×1 IMPLANT
DISSECTOR BLUNT TIP ENDO 5MM (MISCELLANEOUS) ×1 IMPLANT
DRAIN PENROSE 0.5X18 (DRAIN) IMPLANT
DRAPE UTILITY XL STRL (DRAPES) ×2 IMPLANT
DRSG TEGADERM 2-3/8X2-3/4 SM (GAUZE/BANDAGES/DRESSINGS) IMPLANT
ELECT REM PT RETURN 15FT ADLT (MISCELLANEOUS) ×1 IMPLANT
G-TUBE MIC BOLUS 22FR ENFIT (TUBING) IMPLANT
G-TUBE MIC BOLUS 24FR ENFIT (CATHETERS) IMPLANT
GAUZE 4X4 16PLY ~~LOC~~+RFID DBL (SPONGE) ×1 IMPLANT
GAUZE SPONGE 2X2 8PLY STRL LF (GAUZE/BANDAGES/DRESSINGS) IMPLANT
GLOVE BIO SURGEON STRL SZ7.5 (GLOVE) ×1 IMPLANT
GLOVE INDICATOR 8.0 STRL GRN (GLOVE) ×1 IMPLANT
GOWN STRL REUS W/ TWL XL LVL3 (GOWN DISPOSABLE) ×1 IMPLANT
GOWN STRL REUS W/TWL XL LVL3 (GOWN DISPOSABLE) ×1
GRASPER SUT TROCAR 14GX15 (MISCELLANEOUS) IMPLANT
IRRIG SUCT STRYKERFLOW 2 WTIP (MISCELLANEOUS) ×1
IRRIGATION SUCT STRKRFLW 2 WTP (MISCELLANEOUS) IMPLANT
KIT BASIN OR (CUSTOM PROCEDURE TRAY) ×1 IMPLANT
KIT TURNOVER KIT A (KITS) IMPLANT
NDL HYPO 22X1.5 SAFETY MO (MISCELLANEOUS) ×1 IMPLANT
NEEDLE HYPO 22X1.5 SAFETY MO (MISCELLANEOUS) ×1 IMPLANT
NS IRRIG 1000ML POUR BTL (IV SOLUTION) ×1 IMPLANT
PACK UNIVERSAL I (CUSTOM PROCEDURE TRAY) IMPLANT
SCISSORS LAP 5X45 EPIX DISP (ENDOMECHANICALS) ×1 IMPLANT
SET TUBE SMOKE EVAC HIGH FLOW (TUBING) ×1 IMPLANT
SHEARS HARMONIC ACE PLUS 45CM (MISCELLANEOUS) ×1 IMPLANT
SHEATH PEELAWAY 22FR (SHEATH) IMPLANT
SLEEVE ADV FIXATION 12X100MM (TROCAR) IMPLANT
SLEEVE ADV FIXATION 5X100MM (TROCAR) ×2 IMPLANT
SOLUTION ANTFG W/FOAM PAD STRL (MISCELLANEOUS) ×1 IMPLANT
SPIKE FLUID TRANSFER (MISCELLANEOUS) ×1 IMPLANT
SPONGE DRAIN TRACH 4X4 STRL 2S (GAUZE/BANDAGES/DRESSINGS) IMPLANT
STRIP CLOSURE SKIN 1/2X4 (GAUZE/BANDAGES/DRESSINGS) IMPLANT
SUT ETHIBOND 2 0 SH (SUTURE) ×2
SUT ETHIBOND 2 0 SH 36X2 (SUTURE) IMPLANT
SUT ETHILON 2 0 PS N (SUTURE) IMPLANT
SUT MNCRL AB 4-0 PS2 18 (SUTURE) ×1 IMPLANT
SUT SILK 0 SH 30 (SUTURE) IMPLANT
SUT SILK 2 0 SH (SUTURE) IMPLANT
SUT SURGIDAC NAB ES-9 0 48 120 (SUTURE) ×1 IMPLANT
SUT VIC AB 0 UR5 27 (SUTURE) IMPLANT
SUT VICRYL 0 UR6 27IN ABS (SUTURE) ×1 IMPLANT
SYR 20ML ECCENTRIC (SYRINGE) ×1 IMPLANT
SYR 20ML LL LF (SYRINGE) ×1 IMPLANT
TIP INNERVISION DETACH 40FR (MISCELLANEOUS) IMPLANT
TIP INNERVISION DETACH 50FR (MISCELLANEOUS) IMPLANT
TIP INNERVISION DETACH 56FR (MISCELLANEOUS) IMPLANT
TOWEL OR 17X26 10 PK STRL BLUE (TOWEL DISPOSABLE) ×1 IMPLANT
TOWEL OR NON WOVEN STRL DISP B (DISPOSABLE) ×1 IMPLANT
TRAY FOLEY MTR SLVR 16FR STAT (SET/KITS/TRAYS/PACK) ×1 IMPLANT
TROCAR ADV FIXATION 12X100MM (TROCAR) ×1 IMPLANT
TROCAR ADV FIXATION 5X100MM (TROCAR) ×1 IMPLANT
TROCAR XCEL NON-BLD 5MMX100MML (ENDOMECHANICALS) ×1 IMPLANT
TUBE GASTRO BOLUS 22FR ENFIT (TUBING) ×1 IMPLANT
TUBE GASTRO BOLUS 24FR ENFIT (CATHETERS) IMPLANT

## 2023-01-07 NOTE — TOC CM/SW Note (Signed)
  Transition of Care [facility]) Screening Note   Patient Details  Name: Sierra Perkins Date of Birth: 1966-03-20   Transition of Care [facility]) CM/SW Contact:    Amada Jupiter, LCSW Phone Number: 01/07/2023, 9:48 AM    Transition of Care Department [facility]) has reviewed patient and no TOC needs have been identified at this time. We will continue to monitor patient advancement through interdisciplinary progression rounds. If new patient transition needs arise, please place a TOC consult.

## 2023-01-07 NOTE — Anesthesia Procedure Notes (Signed)
Procedure Name: Intubation Date/Time: 01/07/2023 2:04 PM  Performed by: Vanessa Emden, CRNAPre-anesthesia Checklist: Emergency Drugs available, Suction available, Patient identified and Patient being monitored Patient Re-evaluated:Patient Re-evaluated prior to induction Oxygen Delivery Method: Circle system utilized Preoxygenation: Pre-oxygenation with 100% oxygen Induction Type: IV induction, Cricoid Pressure applied and Rapid sequence Laryngoscope Size: Glidescope and 3 Grade View: Grade I Tube type: Oral Tube size: 6.0 mm Number of attempts: 1 Airway Equipment and Method: Video-laryngoscopy Placement Confirmation: ETT inserted through vocal cords under direct vision, positive ETCO2 and breath sounds checked- equal and bilateral Secured at: 21 cm Tube secured with: Tape Dental Injury: Teeth and Oropharynx as per pre-operative assessment  Difficulty Due To: Difficulty was anticipated, Difficult Airway- due to dentition and Difficult Airway- due to limited oral opening

## 2023-01-07 NOTE — Progress Notes (Signed)
Day of Surgery   Subjective/Chief Complaint: Tolerated clears last night but this morning started having trouble tolerating liquids.  She also had some hiccups.   Objective: Vital signs in last 24 hours: Temp:  [97.5 F (36.4 C)-99.2 F (37.3 C)] 99.2 F (37.3 C) (05/01 1300) Pulse Rate:  [60-79] 79 (05/01 1300) Resp:  [18] 18 (05/01 1300) BP: (141-157)/(84-96) 151/87 (05/01 1300) SpO2:  [93 %-97 %] 95 % (05/01 1300) Last BM Date : 01/05/23  Intake/Output from previous day: 04/30 0701 - 05/01 0700 In: 3763.9 [P.O.:480; I.V.:3183.9; IV Piggyback:100] Out: 1125 [Urine:1100; Blood:25] Intake/Output this shift: Total I/O In: 360 [P.O.:360] Out: 600 [Urine:600]  Alert, no apparent distress, resting comfortably, soft, appropriate mild tenderness, incisions clean dry and intact  Lab Results:  Recent Labs    01/06/23 1145 01/07/23 0455  WBC 9.4 10.0  HGB 13.7 13.2  HCT 42.8 40.1  PLT 225 226   BMET Recent Labs    01/07/23 0455  NA 139  K 3.9  CL 104  CO2 28  GLUCOSE 193*  BUN 8  CREATININE 0.77  CALCIUM 8.7*   PT/INR No results for input(s): "LABPROT", "INR" in the last 72 hours. ABG No results for input(s): "PHART", "HCO3" in the last 72 hours.  Invalid input(s): "PCO2", "PO2"  Studies/Results: CT CHEST ABDOMEN PELVIS W CONTRAST  Result Date: 01/07/2023 CLINICAL DATA:  POD 1 sp repair paraesophageal hernia; recurrence on Upper GI EXAM: CT CHEST, ABDOMEN, AND PELVIS WITH CONTRAST TECHNIQUE: Multidetector CT imaging of the chest, abdomen and pelvis was performed following the standard protocol during bolus administration of intravenous contrast. RADIATION DOSE REDUCTION: This exam was performed according to the departmental dose-optimization program which includes automated exposure control, adjustment of the mA and/or kV according to patient size and/or use of iterative reconstruction technique. CONTRAST:  OMNIPAQUE IOHEXOL 300 MG/ML  SOLN COMPARISON:  Upper  GI, 01/07/2023 and 11/24/2022. CT AP, 10/08/2022 and 01/28/2013. FINDINGS: CT CHEST FINDINGS Cardiovascular: No significant vascular findings. No thoracic aortic aneurysm or dissection. Normal heart size. No pericardial effusion. No central or larger pulmonary embolus Mediastinum/Nodes: Small volume of pneumomediastinum. No no enlarged mediastinal, hilar, or axillary lymph nodes. Thyroid gland, trachea, and esophagus demonstrate no significant findings. Lungs/Pleura: Minimal BILATERAL dependent and paraesophageal atelectasis. Lungs are otherwise clear without additional consolidation, mass or suspicious pulmonary nodule. Trace BILATERAL pleural effusions. No pneumothorax. Musculoskeletal: Small volume subcutaneous emphysema at the thoracic inlet and imaged portions of lower neck. No acute chest wall mass or suspicious bone lesion. CT ABDOMEN PELVIS FINDINGS Hepatobiliary: No focal liver abnormality. Mild relative hypodensity of liver, likely transient hepatic attenuation. No gallstones, gallbladder wall thickening, or biliary dilatation. Pancreas: No pancreatic ductal dilatation or surrounding inflammatory changes. Spleen: Normal in size without focal abnormality. Adrenals/Urinary Tract: Adrenal glands are unremarkable. Chronic LEFT inferior renal pole atrophy. Kidneys are otherwise normal, without renal calculi, focal lesion, or hydronephrosis. Bladder is nondistended. Stomach/Bowel: Patulous distal esophagus. Recurrence of large hiatus hernia, with prominent intrathoracic component. No discrete volvulus or evidence of vascular compromise to stomach. Appendix is normal.  Moderate burden of colonic diverticulosis. Enteral opacification of ingested contrast. No extraluminal extravasation. No evidence of bowel wall thickening, distention, or inflammatory changes. Vascular/Lymphatic: No significant vascular findings are present. No enlarged abdominal or pelvic lymph nodes. Reproductive: Uterus and adnexa are  unremarkable. Other: Small volume pneumoperitoneum. Small fat-containing umbilical hernia. No abdominopelvic ascites, focal drainable collection or abscess. Musculoskeletal: Strategically scattered, small volume subcutaneous gas consistent with surgical port sites. No  acute or significant osseous findings. IMPRESSION: 1. Gastroesophageal hernia repair, with large hiatal hernia recurrence. No discrete volvulus or evidence of vascular compromise to the stomach. 2. No CT evidence of extraluminal leakage of ingested contrast. 3. Postsurgical changes, including trace pneumomediastinum, pneumoperitoneum and subcutaneous emphysema at the neck. No focal drainable collection or abscess. These results were called by telephone at the time of interpretation on 01/07/2023 to provider Da Michelle , who verbally acknowledged these results. Roanna Banning, MD [facility] Electronically Signed   By: Roanna Banning M.D.   On: 01/07/2023 12:42   DG UGI W SINGLE CM (SOL OR THIN BA)  Result Date: 01/07/2023 CLINICAL DATA:  282018 S/P repair of paraesophageal hernia 282018 EXAM: UPPER GI SERIES WITH KUB TECHNIQUE: After obtaining a scout radiograph a routine upper GI series was performed using water-soluble contrast (Omnipaque) FLUOROSCOPY: Radiation Exposure Index (as provided by the fluoroscopic device): 24.0 mGy Kerma COMPARISON:  Upper GI exam 11/24/2022, CT abdomen pelvis 10/08/2022 FINDINGS: Scout radiograph demonstrates diffuse gaseous distention of the stomach, small, and large bowel compatible with postoperative ileus. Due to patient condition, a limited upper GI exam was then performed. The patient was only able to tolerate swallowing 20 mL of water-soluble contrast, after which she had nausea and emesis. There is a recurrent hiatal hernia with a large portion of gas distended and rotated stomach in the chest. There is no evidence of extraluminal contrast leak on this  limited exam. Contrast passes through into the infradiaphragmatic portion suggesting there is no obstruction. IMPRESSION: Recurrent hiatal hernia with large portion of gas distended and rotated stomach in the chest. Contrast passes into the infradiaphragmatic portion suggesting this is nonobstructive. No extraluminal contrast leak identified, though note this was a limited exam and the patient was only able to tolerate 20 mL of contrast before vomiting. Diffuse gaseous distension of the stomach, small bowel, and colon suggesting postoperative ileus. These results were called by telephone at the time of interpretation on 01/07/2023 at 8:59 am to provider Zylan Almquist , who verbally acknowledged these results. Electronically Signed   By: Caprice Renshaw M.D.   On: 01/07/2023 09:04    Anti-infectives: Anti-infectives (From admission, onward)    Start     Dose/Rate Route Frequency Ordered Stop   01/06/23 0600  ceFAZolin (ANCEF) IVPB 2g/100 mL premix        2 g 200 mL/hr over 30 Minutes Intravenous On call to O.R. 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  But had some trouble tolerating liquids this morning.  Postoperative upper GI unfortunately demonstrated significant recurrence of her paraesophageal hernia.  There is no evidence of leak.  While we were waiting for my availability and in OR to become open I ordered a CT scan to further evaluate the anatomy.  I discussed the upper GI and CT scans with both the different radiologist.  Again no evidence of overt perforation or leak.  Just unfortunately a large recurrence and probably beginning of a small ileus   Patient is not clinically ill-appearing.  She is again afebrile with no tachycardia and resting comfortably.  My suspicion for a leak is quite low.  But unfortunately she has had an early significant recurrence.  I recommended  return to the operating room for laparoscopic diagnostic, probable removal of some of the diaphragmatic crural repair sutures, gastropexy  and placement of a gastrotomy tube.  We did discuss the possibility of conversion to open.  I discussed that we needed to proceed to the operating room today or tomorrow before everything gets to [facility] and makes this more technically challenge.  I do not think there is really any role to try to reapproximate her crura today and placed mesh since she has had such an early recurrence.  I discussed the case with several of my partners along with one of my colleagues at [facility] who agreed with this plan.  I rediscussed the risk and benefits of the surgery including but not limited to bleeding, infection, injury to surrounding structures, need to convert to open procedure, blood clot formation, perioperative cardiac and pulmonary events, G-tube issues, recurrent hiatal hernia, pain from G-tube as well as gastropexy sutures, we discussed G-tube issues such as irritation, leak, dislodgment.  We discussed the typical recovery from this type of procedure.  We did discuss need for potential additional procedures on down the road.  As long as she is not terribly symptomatic I would not recommend any additional surgery for at least 6 months.  Her questions were asked and answered.   Iv abx scds  s/p Procedure(s): LAPAROSCOPIC DIAGNOSTIC, POSSIBLE UPPER ENDOSCOPY, GASTROPEXY, LAP GTUBE PLACEMENT (N/A) See above  LOS: 0 days    Gaynelle Adu 01/07/2023

## 2023-01-07 NOTE — Progress Notes (Signed)
1310 Unable to take Tylenol unable to swallow and use the Peridex due to feeling nauseated.

## 2023-01-07 NOTE — Anesthesia Preprocedure Evaluation (Addendum)
Anesthesia Evaluation  Patient identified by MRN, date of birth, ID band Patient awake    Reviewed: Allergy & Precautions, NPO status , Patient's Chart, lab work & pertinent test results  History of Anesthesia Complications (+) PONV and history of anesthetic complications  Airway Mallampati: III  TM Distance: >3 FB Neck ROM: Full    Dental no notable dental hx.    Pulmonary neg pulmonary ROS   Pulmonary exam normal        Cardiovascular negative cardio ROS Normal cardiovascular exam     Neuro/Psych  PSYCHIATRIC DISORDERS      negative neurological ROS     GI/Hepatic Neg liver ROS,GERD  Medicated and Controlled,,  Endo/Other  Hypothyroidism    Renal/GU Renal disease     Musculoskeletal  (+) Arthritis ,    Abdominal   Peds  Hematology  (+) REFUSES BLOOD PRODUCTS  Anesthesia Other Findings paraesophageal hernia  Reproductive/Obstetrics                             Anesthesia Physical Anesthesia Plan  ASA: 2  Anesthesia Plan: General   Post-op Pain Management:    Induction: Intravenous and Rapid sequence  PONV Risk Score and Plan: 4 or greater and Ondansetron, Dexamethasone, Propofol infusion, Midazolam and Treatment may vary due to age or medical condition  Airway Management Planned: Oral ETT and Video Laryngoscope Planned  Additional Equipment:   Intra-op Plan:   Post-operative Plan: Extubation in OR  Informed Consent: I have reviewed the patients History and Physical, chart, labs and discussed the procedure including the risks, benefits and alternatives for the proposed anesthesia with the patient or authorized representative who has indicated his/her understanding and acceptance.     Dental advisory given  Plan Discussed with: CRNA  Anesthesia Plan Comments:        Anesthesia Quick Evaluation

## 2023-01-07 NOTE — Anesthesia Postprocedure Evaluation (Signed)
Anesthesia Post Note  Patient: Yaris Ferrell  Procedure(s) Performed: LAPAROSCOPIC DIAGNOSTIC, UPPER ENDOSCOPY, GASTROPEXY, LAP GTUBE PLACEMENT, laparoscopic repair of hiatal hernia     Patient location during evaluation: PACU Anesthesia Type: General Level of consciousness: awake and alert and oriented Pain management: pain level controlled Vital Signs Assessment: post-procedure vital signs reviewed and stable Respiratory status: spontaneous breathing, nonlabored ventilation and respiratory function stable Cardiovascular status: blood pressure returned to baseline and stable Postop Assessment: no apparent nausea or vomiting Anesthetic complications: yes   Encounter Notable Events  Notable Event Outcome Phase Comment  Difficult to intubate - expected  Intraprocedure Filed from anesthesia note documentation.    Last Vitals:  Vitals:   01/07/23 1723 01/07/23 1730  BP:  (!) 155/92  Pulse: 68 61  Resp: 14 12  Temp:    SpO2: 94% 94%    Last Pain:  Vitals:   01/07/23 1730  TempSrc:   PainSc: Asleep                 Clarice Zulauf A.

## 2023-01-07 NOTE — Transfer of Care (Signed)
Immediate Anesthesia Transfer of Care Note  [name]  Procedure(s) Performed: LAPAROSCOPIC DIAGNOSTIC, UPPER ENDOSCOPY, GASTROPEXY, LAP GTUBE PLACEMENT, laparoscopic repair of hiatal hernia  Patient Location: PACU  Anesthesia Type:General  Level of Consciousness: sedated  Airway & Oxygen Therapy: Patient Spontanous Breathing and Patient connected to face mask oxygen  Post-op Assessment: Report given to RN and Post -op Vital signs reviewed and stable  Post vital signs: Reviewed and stable  Last Vitals:  Vitals Value Taken Time  BP 147/91 01/07/23 1636  Temp    Pulse 82 01/07/23 1636  Resp 15 01/07/23 1636  SpO2 99 % 01/07/23 1636    Last Pain:  Vitals:   01/07/23 1325  TempSrc:   PainSc: 3       Patients Stated Pain Goal: 2 (01/06/23 2018)  Complications:  Encounter Notable Events  Notable Event Outcome Phase Comment  Difficult to intubate - expected  Intraprocedure Filed from anesthesia note documentation.

## 2023-01-07 NOTE — Op Note (Signed)
01/07/2023  4:36 PM  PATIENT:  Sierra Perkins  57 y.o. female  PRE-OPERATIVE DIAGNOSIS:  Recurrent paraesophageal hernia; s/p robotic of paraesophageal hernia with partial wrap 01/06/23  POST-OPERATIVE DIAGNOSIS:  same  PROCEDURE:  Procedure(s): LAPAROSCOPIC DIAGNOSTIC, UPPER ENDOSCOPY, LAPAROSCOPIC GASTROPEXY, LAP GTUBE PLACEMENT, laparoscopic repair of RECURRENT PARAESOPHAGEAL hiatal hernia  UPPER ENDOSCOPY - DR Fredricka Bonine SURGEON:  Surgeon(s): Gaynelle Adu, MD  ASSISTANTS: DR Fredricka Bonine - UPPER ENDOSCOPY   ANESTHESIA:   general  DRAINS: Gastrostomy Tube   LOCAL MEDICATIONS USED:  MARCAINE     SPECIMEN:  No Specimen  DISPOSITION OF SPECIMEN:  N/A  COUNTS:  YES  INDICATION FOR PROCEDURE: 57 year old female who underwent robotic repair of paraesophageal hiatal hernia with partial fundoplication on April 30 was kept overnight for observation.  She initially tolerated clears well.  On the morning of postoperative day 1 her vital signs are stable but she reported difficulty tolerating liquids.  She had no tachycardia or fever and her labs were unremarkable.  She underwent routine postoperative day 1 upper GI which unfortunately showed a recurrent large hiatal hernia.  She then underwent CT scan to look for any signs of a leak.  On her upper GI there is no evidence of leak.  On her CT scan there was no evidence of leak either.  I came back and saw the patient and discussed with her and her husband the findings.  Since she was having some p.o. tolerance issues and the fact that this was an early postop recurrence I recommended proceeding back to the operating room for diagnostic laparoscopy, laparoscopic gastropexy and laparoscopic gastric tube placement.  We discussed different possibilities that we may encounter during surgery.  I reviewed her case with several of my partners along with one of my colleagues at [facility] in [place].  Please see progress notes for additional information  PROCEDURE:  Patient was taken back to operating room for at [facility] after informed consent was obtained.  She was placed supine on the operating room table.  General endotracheal anesthesia was established.  Sequential compression devices were placed.  Her surgical dressings from yesterday were removed.  Her abdomen was prepped and draped in the usual standard surgical fashion.  Her arms have been tucked at her side with the appropriate padding.  She received IV antibiotic.  The surgical timeout was performed.  Optiview technique was used to gain access to the abdomen.  A small incision was made about 1 fingerbreadth below the left subcostal margin in the midclavicular line.  Then using a 0 degree 5 mm laparoscope I advanced the laparoscope through a 5 mm trocar through all layers of the wall and carefully entered the abdominal cavity.  Pneumoperitoneum was smoothly established to a patient pressure of 15 mmHg.  There is no evidence of injury to surrounding viscera.  She had some colonic distention that was noticed on her upper GI.  Patient was placed in reverse Trendelenburg.  Additional incisions were made for trocars a 5 mm trocar several inches above into the left of the umbilicus.  A 5 mm trocar in the right lateral abdominal wall a 12 mm trocar in the right mid abdomen and then I used her old subxiphoid trocar site to replace the [facility] liver retractor to lift up the left lobe of the liver to expose the hiatus.  The stomach was distended with air.  I had anesthesia placed a Ewald tube in the mouth down the esophagus and visualized it coming into the  stomach.  The stomach was immediately evacuated of air.  And decompressed.  The Ewald tube was pulled back into the proximal esophagus.  The wrap appeared to be herniated above the diaphragm along with some of the proximal stomach.  I inspected the diaphragmatic repair.  I had placed 7 interrupted sutures of 0 Ethibond yesterday.  All the sutures were intact.   The suture closest to the esophagus was a little bit loose but still intact.  Using a laparoscopic Tanja [place] I was able to reduce the herniated stomach and wrap back into the abdomen.  There was also several centimeters of esophagus that came down.  It stayed back in the abdomen.  It did not telescoped back up into the mediastinum.  The diaphragmatic hiatus was wider than I had left it yesterday.  I do not know if it had been dilated from having part of the stomach herniated through it.  But the gap and the diaphragm hiatus was larger than I left it yesterday.  The diaphragmatic muscles appear to be in good condition.  Instead of taking out additional sutures and opening up the defect and making a larger defect I decided to place some additional sutures to reapproximate the diaphragmatic crura to make the hiatus more snug.  Dr. Gerrit Friends had stepped into the operating room and agreed with that plan.  I reduced intra-abdominal pressure to about 10 mmHg.  Using a Endo Stitch I placed 2 interrupted 0 Ethibond Surgidac sutures through the left and right crura each secured with a titanium tie knot.  Again the entire stomach stayed easily within the abdominal cavity along with about 4 cm of esophagus.  The wrap was intact.  It was not twisted.  Dr. Fredricka Bonine had stepped into the operating room at this point.  I asked her to perform an upper endoscopy.  Please see her procedure note.  But there is no evidence of esophageal injury on upper endoscopy.  There is no evidence of bubbles or leak.  She was able to advance the scope with into the gastric body and retroflexed and the wrap appeared intact with a Hill grade 1 valve view.  Stomach was decompressed and the scope removed.  I then placed multiple interrupted sutures of 0 silk along the greater curvature of the stomach just below the wrap to the muscle and peritoneum underneath the rib cage.  There were a total of 5 gastropexy sutures placed.  I then placed a 2-0 silk pursestring  suture along the greater curvature near the proximal antrum.  I then placed 2-0 Ethibond sutures superior and inferior to the pursestring suture and remove the needles.  I then made a gastrotomy in the middle of the pursestring.  I then placed a 22 French MIC gastrotomy tube through the left upper quadrant 5 mm trocar.  Prior to advancing the gastric tube into the stomach lumen we tested the balloon and it was intact.  The gastrotomy tube was advanced into the stomach.  There is evidence of gastric contents.  The balloon was inflated with approximately 8 cc of water.  The pursestring suture was then tied down and the needle was removed from the abdominal cavity.  We reduced pneumoperitoneum even further.  I then used a PMI suture passer to bring up the 2 Ethibond sutures that had been placed above and below the gastrotomy tube as transfascial Stamm sutures to anchor the stomach to the abdominal wall.  The G-tube was secured at around 3-1/2 cm.  2  interrupted 2-0 nylon sutures were placed through the skin and through the bolster.  The 12 mm trocar was removed and the fascial defect was closed with interrupted 0 Vicryl using PMI suture passer with laparoscopic guidance.  The [facility] liver retractor was removed.  Trocars were removed and skin incisions were closed with a 4 Monocryl.  Incisions from today and incisions from yesterday were redressed with Steri-Strips 2 x 2's and Tegaderms.  All needle, instrument, and sponge counts were correct x 2.  The patient was extubated and taken to the recovery room in stable condition.  I updated the husband at the conclusion of the procedure  PLAN OF CARE: Admit to inpatient   PATIENT DISPOSITION:  PACU - hemodynamically stable.   Delay start of Pharmacological VTE agent (>24hrs) due to surgical blood loss or risk of bleeding:  no  Mary Sella. Andrey Campanile, MD, FACS General, Bariatric, & Minimally Invasive Surgery [facility], [place]

## 2023-01-08 ENCOUNTER — Inpatient Hospital Stay (HOSPITAL_COMMUNITY): Payer: BC Managed Care – PPO

## 2023-01-08 ENCOUNTER — Encounter (HOSPITAL_COMMUNITY): Payer: Self-pay | Admitting: General Surgery

## 2023-01-08 LAB — GLUCOSE, CAPILLARY
Glucose-Capillary: 128 mg/dL — ABNORMAL HIGH (ref 70–99)
Glucose-Capillary: 217 mg/dL — ABNORMAL HIGH (ref 70–99)
Glucose-Capillary: 171 mg/dL — ABNORMAL HIGH (ref 70–99)

## 2023-01-08 LAB — BASIC METABOLIC PANEL
Anion gap: 8 (ref 5–15)
BUN: 9 mg/dL (ref 6–20)
CO2: 27 mmol/L (ref 22–32)
Calcium: 8.2 mg/dL — ABNORMAL LOW (ref 8.9–10.3)
Chloride: 101 mmol/L (ref 98–111)
Creatinine, Ser: 0.73 mg/dL (ref 0.44–1.00)
GFR, Estimated: >60 mL/min (ref >60)
Glucose, Bld: 186 mg/dL — ABNORMAL HIGH (ref 70–99)
Potassium: 4 mmol/L (ref 3.5–5.1)
Sodium: 136 mmol/L (ref 135–145)

## 2023-01-08 LAB — CBC
HCT: 39.4 % (ref 36.0–46.0)
Hemoglobin: 12.8 g/dL (ref 12.0–15.0)
MCH: 29.4 pg (ref 26.0–34.0)
MCHC: 32.5 g/dL (ref 30.0–36.0)
MCV: 90.4 fL (ref 80.0–100.0)
Platelets: 197 10*3/uL (ref 150–400)
RBC: 4.36 MIL/uL (ref 3.87–5.11)
RDW: 12.6 % (ref 11.5–15.5)
WBC: 9 10*3/uL (ref 4.0–10.5)
nRBC: 0 % (ref 0.0–0.2)

## 2023-01-08 MED ORDER — DEXAMETHASONE SODIUM PHOSPHATE 4 MG/ML IJ SOLN
4.0000 mg | Freq: Three times a day (TID) | INTRAMUSCULAR | Status: DC
  Administered 2023-01-08 – 2023-01-09 (×4): 4 mg via INTRAVENOUS
  Filled 2023-01-08 (×3): qty 1

## 2023-01-08 MED ORDER — IOHEXOL 300 MG/ML  SOLN
50.0000 mL | Freq: Once | INTRAMUSCULAR | Status: AC | PRN
  Administered 2023-01-08: 50 mL via ORAL

## 2023-01-08 MED ORDER — SODIUM CHLORIDE 0.9 % IV SOLN: INTRAVENOUS | Status: DC

## 2023-01-08 MED ORDER — ACETAMINOPHEN 500 MG PO TABS
1000.0000 mg | ORAL_TABLET | Freq: Three times a day (TID) | ORAL | Status: DC
  Administered 2023-01-08: 500 mg via ORAL
  Filled 2023-01-08 (×2): qty 2

## 2023-01-08 MED ORDER — INSULIN ASPART 100 UNIT/ML IJ SOLN
0.0000 [IU] | Freq: Three times a day (TID) | INTRAMUSCULAR | Status: DC
  Administered 2023-01-08 – 2023-01-09 (×2): 5 [IU] via SUBCUTANEOUS

## 2023-01-08 MED ORDER — INSULIN ASPART 100 UNIT/ML IJ SOLN: 0.0000 [IU] | Freq: Every day | INTRAMUSCULAR | Status: DC

## 2023-01-08 NOTE — Progress Notes (Signed)
1 Day Post-Op   Subjective/Chief Complaint: Patient seen and examined while in fluoroscopy this morning No nausea, burping or belching no flatus.  Feels a little bit bloated.  She reports improved swallowing but just had ice chips last night no water.  She reports left upper quadrant discomfort   Objective: Vital signs in last 24 hours: Temp:  [98.1 F (36.7 C)-99.2 F (37.3 C)] 98.9 F (37.2 C) (05/02 0512) Pulse Rate:  [58-88] 72 (05/02 0512) Resp:  [11-19] 18 (05/02 0512) BP: (131-163)/(79-96) 135/81 (05/02 0512) SpO2:  [90 %-99 %] 94 % (05/02 0512) Weight:  [63.9 kg] 63.9 kg (05/01 1325) Last BM Date : 02/04/23  Intake/Output from previous day: 05/01 0701 - 05/02 0700 In: 2989.7 [P.O.:540; I.V.:2239.7; IV Piggyback:210] Out: 2560 [Urine:2550; Blood:10] Intake/Output this shift: Total I/O In: 299.9 [I.V.:299.9] Out: 0   Alert, nontoxic, sitting in wheelchair Nonlabored, Soft, incisions-clean dry and intact; mild appropriate tenderness in left upper quadrant; G-tube intact and secure, a little bit distended  Lab Results:  Recent Labs    01/07/23 0455 01/08/23 0505  WBC 10.0 9.0  HGB 13.2 12.8  HCT 40.1 39.4  PLT 226 197   BMET Recent Labs    01/07/23 0455 01/08/23 0505  NA 139 136  K 3.9 4.0  CL 104 101  CO2 28 27  GLUCOSE 193* 186*  BUN 8 9  CREATININE 0.77 0.73  CALCIUM 8.7* 8.2*   PT/INR No results for input(s): "LABPROT", "INR" in the last 72 hours. ABG No results for input(s): "PHART", "HCO3" in the last 72 hours.  Invalid input(s): "PCO2", "PO2"  Studies/Results: DG UGI W SINGLE CM (SOL OR THIN BA)  Result Date: 01/08/2023 CLINICAL DATA:  1 day postop from repair of paraesophageal hiatal hernia, with gastropexy and gastrostomy tube placement. Evaluate for postop leak or obstruction. EXAM: UPPER GI SERIES WITH KUB TECHNIQUE: After obtaining a scout radiograph a routine upper GI series was performed using diluted Omnipaque 300 water-soluble  contrast. FLUOROSCOPY: Radiation Exposure Index (as provided by the fluoroscopic device): 30.8 mGy Kerma COMPARISON:  01/07/2023 FINDINGS: The scout radiograph shows percutaneous gastrostomy tube in the left abdomen. Gaseous distention of the colon is consistent with postop ileus. Small amount of residual contrast material is seen within the ascending colon as well as left-sided colonic diverticula. Previously seen hiatal hernia is no longer visualized. Severe narrowing is seen at the GE junction, likely due to fundoplication wrap as well as postop edema. This results in retention of contrast within the esophagus and delayed passage of contrast with jet effect into the stomach. No evidence of contrast leak or extravasation. Gaseous distention of the stomach is noted, as well as stasis of contrast in the proximal and mid esophagus. IMPRESSION: No evidence of persistent hiatal hernia, or postop leak. Severe narrowing at the GE junction, likely due to fundoplication wrap as well as postop edema. This results in delayed passage of contrast into the stomach with "jet effect". Postop colonic ileus. Electronically Signed   By: Danae Orleans M.D.   On: 01/08/2023 08:59   CT CHEST ABDOMEN PELVIS W CONTRAST  Result Date: 01/07/2023 CLINICAL DATA:  POD 1 sp repair paraesophageal hernia; recurrence on Upper GI EXAM: CT CHEST, ABDOMEN, AND PELVIS WITH CONTRAST TECHNIQUE: Multidetector CT imaging of the chest, abdomen and pelvis was performed following the standard protocol during bolus administration of intravenous contrast. RADIATION DOSE REDUCTION: This exam was performed according to the departmental dose-optimization program which includes automated exposure control, adjustment of  the mA and/or kV according to patient size and/or use of iterative reconstruction technique. CONTRAST:  OMNIPAQUE IOHEXOL 300 MG/ML  SOLN COMPARISON:  Upper GI, 01/07/2023 and 11/24/2022. CT AP, 10/08/2022 and 01/28/2013. FINDINGS: CT CHEST  FINDINGS Cardiovascular: No significant vascular findings. No thoracic aortic aneurysm or dissection. Normal heart size. No pericardial effusion. No central or larger pulmonary embolus Mediastinum/Nodes: Small volume of pneumomediastinum. No no enlarged mediastinal, hilar, or axillary lymph nodes. Thyroid gland, trachea, and esophagus demonstrate no significant findings. Lungs/Pleura: Minimal BILATERAL dependent and paraesophageal atelectasis. Lungs are otherwise clear without additional consolidation, mass or suspicious pulmonary nodule. Trace BILATERAL pleural effusions. No pneumothorax. Musculoskeletal: Small volume subcutaneous emphysema at the thoracic inlet and imaged portions of lower neck. No acute chest wall mass or suspicious bone lesion. CT ABDOMEN PELVIS FINDINGS Hepatobiliary: No focal liver abnormality. Mild relative hypodensity of liver, likely transient hepatic attenuation. No gallstones, gallbladder wall thickening, or biliary dilatation. Pancreas: No pancreatic ductal dilatation or surrounding inflammatory changes. Spleen: Normal in size without focal abnormality. Adrenals/Urinary Tract: Adrenal glands are unremarkable. Chronic LEFT inferior renal pole atrophy. Kidneys are otherwise normal, without renal calculi, focal lesion, or hydronephrosis. Bladder is nondistended. Stomach/Bowel: Patulous distal esophagus. Recurrence of large hiatus hernia, with prominent intrathoracic component. No discrete volvulus or evidence of vascular compromise to stomach. Appendix is normal.  Moderate burden of colonic diverticulosis. Enteral opacification of ingested contrast. No extraluminal extravasation. No evidence of bowel wall thickening, distention, or inflammatory changes. Vascular/Lymphatic: No significant vascular findings are present. No enlarged abdominal or pelvic lymph nodes. Reproductive: Uterus and adnexa are unremarkable. Other: Small volume pneumoperitoneum. Small fat-containing umbilical hernia. No  abdominopelvic ascites, focal drainable collection or abscess. Musculoskeletal: Strategically scattered, small volume subcutaneous gas consistent with surgical port sites. No acute or significant osseous findings. IMPRESSION: 1. Gastroesophageal hernia repair, with large hiatal hernia recurrence. No discrete volvulus or evidence of vascular compromise to the stomach. 2. No CT evidence of extraluminal leakage of ingested contrast. 3. Postsurgical changes, including trace pneumomediastinum, pneumoperitoneum and subcutaneous emphysema at the neck. No focal drainable collection or abscess. These results were called by telephone at the time of interpretation on 01/07/2023 to provider Steadman Prosperi , who verbally acknowledged these results. Roanna Banning, MD [facility] Electronically Signed   By: Roanna Banning M.D.   On: 01/07/2023 12:42   DG UGI W SINGLE CM (SOL OR THIN BA)  Result Date: 01/07/2023 CLINICAL DATA:  282018 S/P repair of paraesophageal hernia 282018 EXAM: UPPER GI SERIES WITH KUB TECHNIQUE: After obtaining a scout radiograph a routine upper GI series was performed using water-soluble contrast (Omnipaque) FLUOROSCOPY: Radiation Exposure Index (as provided by the fluoroscopic device): 24.0 mGy Kerma COMPARISON:  Upper GI exam 11/24/2022, CT abdomen pelvis 10/08/2022 FINDINGS: Scout radiograph demonstrates diffuse gaseous distention of the stomach, small, and large bowel compatible with postoperative ileus. Due to patient condition, a limited upper GI exam was then performed. The patient was only able to tolerate swallowing 20 mL of water-soluble contrast, after which she had nausea and emesis. There is a recurrent hiatal hernia with a large portion of gas distended and rotated stomach in the chest. There is no evidence of extraluminal contrast leak on this limited exam. Contrast passes through into the infradiaphragmatic portion suggesting there is no  obstruction. IMPRESSION: Recurrent hiatal hernia with large portion of gas distended and rotated stomach in the chest. Contrast passes into the infradiaphragmatic portion suggesting this is nonobstructive. No extraluminal contrast leak  identified, though note this was a limited exam and the patient was only able to tolerate 20 mL of contrast before vomiting. Diffuse gaseous distension of the stomach, small bowel, and colon suggesting postoperative ileus. These results were called by telephone at the time of interpretation on 01/07/2023 at 8:59 am to provider Bari Handshoe , who verbally acknowledged these results. Electronically Signed   By: Caprice Renshaw M.D.   On: 01/07/2023 09:04    Anti-infectives: Anti-infectives (From admission, onward)    Start     Dose/Rate Route Frequency Ordered Stop   01/07/23 1330  ceFAZolin (ANCEF) IVPB 2g/100 mL premix        2 g 200 mL/hr over 30 Minutes Intravenous  Once 01/07/23 1323 01/07/23 1420   01/07/23 1317  ceFAZolin (ANCEF) 2-4 GM/100ML-% IVPB       Note to Pharmacy: Jolyn Nap B: cabinet override      01/07/23 1317 01/07/23 1416   01/06/23 0600  ceFAZolin (ANCEF) IVPB 2g/100 mL premix        2 g 200 mL/hr over 30 Minutes Intravenous On call to O.R. 01/06/23 0543 01/06/23 0759       Assessment/Plan: Status post robotic repair of paraesophageal hernia with partial fundoplication and upper endoscopy April 29 Early recurrence on postoperative day 1 s/p Procedure(s): LAPAROSCOPIC DIAGNOSTIC, UPPER ENDOSCOPY, GASTROPEXY, LAP GTUBE PLACEMENT, laparoscopic repair of hiatal hernia (N/A) 01/07/23   no hernia on UGI. no leak. but tight at diaphragm closure likely due to edema. so I ordered decadron to help. changed IVF.  put on sliding scale. ordered CLD. pt may need to take slow. having pain in LUQ likely from where stomach anchored to abdominal wall. try to maximize pain control with tylenol, robaxin, toradol, oxycodone first and use morphine for  breakthru  Add sliding scale insulin Continue chemical DVT prophylaxis  LOS: 1 day    Sierra Perkins 01/08/2023

## 2023-01-09 LAB — GLUCOSE, CAPILLARY
Glucose-Capillary: 119 mg/dL — ABNORMAL HIGH (ref 70–99)
Glucose-Capillary: 213 mg/dL — ABNORMAL HIGH (ref 70–99)

## 2023-01-09 MED ORDER — ACETAMINOPHEN 500 MG PO TABS: 1000.0000 mg | ORAL_TABLET | Freq: Three times a day (TID) | ORAL | 0 refills | Status: AC

## 2023-01-09 MED ORDER — METHOCARBAMOL 750 MG PO TABS: 750.0000 mg | ORAL_TABLET | Freq: Three times a day (TID) | ORAL | 0 refills | Status: AC | PRN

## 2023-01-09 MED ORDER — ONDANSETRON HCL 4 MG PO TABS: 4.0000 mg | ORAL_TABLET | Freq: Four times a day (QID) | ORAL | 1 refills | Status: AC | PRN

## 2023-01-09 MED ORDER — OMEPRAZOLE 40 MG PO CPDR: 40.0000 mg | DELAYED_RELEASE_CAPSULE | Freq: Every day | ORAL | 1 refills | Status: AC

## 2023-01-09 MED ORDER — OXYCODONE HCL 5 MG PO TABS: 5.0000 mg | ORAL_TABLET | Freq: Four times a day (QID) | ORAL | 0 refills | Status: AC | PRN

## 2023-01-09 NOTE — Discharge Instructions (Signed)
EATING AFTER YOUR ESOPHAGEAL SURGERY (Stomach Fundoplication, Hiatal Hernia repair, Achalasia surgery, etc)  ######################################################################  EAT Start with a full liquid diet (see below) Gradually transition to a high fiber diet with a fiber supplement over the next month after discharge.    WALK Walk an hour a day.  Control your pain to do that.    CONTROL PAIN Control pain so that you can walk, sleep, tolerate sneezing/coughing, go up/down stairs.  HAVE A BOWEL MOVEMENT DAILY Keep your bowels regular to avoid problems.  OK to try a laxative to override constipation.  OK to use an antidairrheal to slow down diarrhea.  Call if not better after 2 tries  CALL IF YOU HAVE PROBLEMS/CONCERNS Call if you are still struggling despite following these instructions. Call if you have concerns not answered by these instructions  ######################################################################   After your esophageal surgery, expect some sticking with swallowing over the next 1-2 months.    If food sticks when you eat, it is called "dysphagia".  This is due to swelling around your esophagus at the wrap & hiatal diaphragm repair.  It will gradually ease off over the next few months.  To help you through this temporary phase, we start you out on a full liquid diet.  Your first meal in the hospital was thin liquids.  You should have been given a full liquid diet by the time you left the hospital. Stay on clears and full liquids for the first week. Some patients may need to stay on a liquid diet for up to 2 weeks if having trouble swallowing.  Once tolerating that well, you can advance to pureed diet.   We ask patients to stay on a pureed diet for the 2nd-3rd week to avoid anything getting "stuck" near your recent surgery.  Don't be alarmed if your ability to swallow doesn't progress according to this plan.  Everyone is different and some diets can advance  more or less quickly.    It is often helpful to crush your medications or split them as they can sometimes stick, especially the first week or so.   Some BASIC RULES to follow are: Maintain an upright position whenever eating or drinking. Take small bites - just a teaspoon size bite at a time. Eat slowly.  It may also help to eat only one food at a time. Consider nibbling through smaller, more frequent meals & avoid the urge to eat BIG meals Do not push through feelings of fullness, nausea, or bloatedness Do not mix solid foods and liquids in the same mouthful Try not to "wash foods down" with large gulps of liquids. Avoid carbonated (bubbly/fizzy) drinks.   Avoid foods that make you feel gassy or bloated.  Start with bland foods first.  Wait on trying greasy, fried, or spicy meals until you are tolerating more bland solids well. Understand that it will be hard to burp and belch at first.  This gradually improves with time.  Expect to be more gassy/flatulent/bloated initially.  Walking will help your body manage it better. Consider using medications for bloating that contain simethicone such as  Maalox or Gas-X  Consider crushing her medications, especially smaller pills.  The ability to swallow pills should get easier after a few weeks Eat in a relaxed atmosphere & minimize distractions. Avoid talking while eating.   Do not use straws. Following each meal, sit in an upright position (90 degree angle) for 60 to 90 minutes.  Going for a short walk can   help as well If food does stick, don't panic.  Try to relax and let the food pass on its own.  Sipping WARM LIQUID such as strong hot black tea can also help slide it down.   Be gradual in changes & use common sense:  -If you easily tolerating a certain "level" of foods, advance to the next level gradually -If you are having trouble swallowing a particular food, then avoid it.   -If food is sticking when you advance your diet, go back to  thinner previous diet (the lower LEVEL) for 1-2 days.  LEVEL 2 = PUREED DIET  Start 1- 2 WEEKS AFTER SURGERY IF YOU ARE TOLERATING A FULL LIQUID DIET EASILY  -Foods in this group are pureed or blenderized to a smooth, mashed potato-like consistency.  -If necessary, the pureed foods can keep their shape with the addition of a thickening agent.   -Meat should be pureed to a smooth, pasty consistency.  Hot broth or gravy may be added to the pureed meat, approximately 1 oz. of liquid per 3 oz. serving of meat. -CAUTION:  If any foods do not puree into a smooth consistency, swallowing will be more difficult.  (For example, nuts or seeds sometimes do not blend well.)  Hot Foods Cold Foods  Pureed scrambled eggs and cheese Pureed cottage cheese  Baby cereals Thickened juices and nectars  Thinned cooked cereals (no lumps) Thickened milk or eggnog  Pureed French toast or pancakes Ensure  Mashed potatoes Ice cream  Pureed parsley, au gratin, scalloped potatoes, candied sweet potatoes Fruit or Italian ice, sherbet  Pureed buttered or alfredo noodles Plain yogurt  Pureed vegetables (no corn or peas) Instant breakfast  Pureed soups and creamed soups Smooth pudding, mousse, custard  Pureed scalloped apples Whipped gelatin  Gravies Sugar, syrup, honey, jelly  Sauces, cheese, tomato, barbecue, white, creamed Cream  Any baby food Creamer  Alcohol in moderation (not beer or champagne) Margarine  Coffee or tea Mayonnaise   Ketchup, mustard   Apple sauce   SAMPLE MENU:  PUREED DIET Breakfast Lunch Dinner  Orange juice, 1/2 cup Cream of wheat, 1/2 cup Pineapple juice, 1/2 cup Pureed turkey, barley soup, 3/4 cup Pureed Hawaiian chicken, 3 oz  Scrambled eggs, mashed or blended with cheese, 1/2 cup Tea or coffee, 1 cup  Whole milk, 1 cup  Non-dairy creamer, 2 Tbsp. Mashed potatoes, 1/2 cup Pureed cooled broccoli, 1/2 cup Apple sauce, 1/2 cup Coffee or tea Mashed potatoes, 1/2 cup Pureed spinach,  1/2 cup Frozen yogurt, 1/2 cup Tea or coffee      LEVEL 3 = SOFT DIET  After your first 4 weeks, you can advance to a soft diet.   Keep on this diet until everything goes down easily.  Hot Foods Cold Foods  White fish Cottage cheese  Stuffed fish Junior baby fruit  Baby food meals Semi thickened juices  Minced soft cooked, scrambled, poached eggs nectars  Souffle & omelets Ripe mashed bananas  Cooked cereals Canned fruit, pineapple sauce, milk  potatoes Milkshake  Buttered or Alfredo noodles Custard  Cooked cooled vegetable Puddings, including tapioca  Sherbet Yogurt  Vegetable soup or alphabet soup Fruit ice, Italian ice  Gravies Whipped gelatin  Sugar, syrup, honey, jelly Junior baby desserts  Sauces:  Cheese, creamed, barbecue, tomato, white Cream  Coffee or tea Margarine   SAMPLE MENU:  LEVEL 3 Breakfast Lunch Dinner  Orange juice, 1/2 cup Oatmeal, 1/2 cup Scrambled eggs with cheese, 1/2 cup Decaffeinated tea,   1 cup Whole milk, 1 cup Non-dairy creamer, 2 Tbsp Pineapple juice, 1/2 cup Minced beef, 3 oz Gravy, 2 Tbsp Mashed potatoes, 1/2 cup Minced fresh broccoli, 1/2 cup Applesauce, 1/2 cup Coffee, 1 cup Turkey, barley soup, 3/4 cup Minced Hawaiian chicken, 3 oz Mashed potatoes, 1/2 cup Cooked spinach, 1/2 cup Frozen yogurt, 1/2 cup Non-dairy creamer, 2 Tbsp      LEVEL 4 = CHOPPED DIET  -After all the foods in level 3 (soft diet) are passing through well you should advance up to more chopped foods.  -It is still important to cut these foods into small pieces and eat slowly.  Hot Foods Cold Foods  Poultry Cottage cheese  Chopped Swedish meatballs Yogurt  Meat salads (ground or flaked meat) Milk  Flaked fish (tuna) Milkshakes  Poached or scrambled eggs Soft, cold, dry cereal  Souffles and omelets Fruit juices or nectars  Cooked cereals Chopped canned fruit  Chopped French toast or pancakes Canned fruit cocktail  Noodles or pasta (no rice) Pudding,  mousse, custard  Cooked vegetables (no frozen peas, corn, or mixed vegetables) Green salad  Canned small sweet peas Ice cream  Creamed soup or vegetable soup Fruit ice, Italian ice  Pureed vegetable soup or alphabet soup Non-dairy creamer  Ground scalloped apples Margarine  Gravies Mayonnaise  Sauces:  Cheese, creamed, barbecue, tomato, white Ketchup  Coffee or tea Mustard   SAMPLE MENU:  LEVEL 4 Breakfast Lunch Dinner  Orange juice, 1/2 cup Oatmeal, 1/2 cup Scrambled eggs with cheese, 1/2 cup Decaffeinated tea, 1 cup Whole milk, 1 cup Non-dairy creamer, 2 Tbsp Ketchup, 1 Tbsp Margarine, 1 tsp Salt, 1/4 tsp Sugar, 2 tsp Pineapple juice, 1/2 cup Ground beef, 3 oz Gravy, 2 Tbsp Mashed potatoes, 1/2 cup Cooked spinach, 1/2 cup Applesauce, 1/2 cup Decaffeinated coffee Whole milk Non-dairy creamer, 2 Tbsp Margarine, 1 tsp Salt, 1/4 tsp Pureed turkey, barley soup, 3/4 cup Barbecue chicken, 3 oz Mashed potatoes, 1/2 cup Ground fresh broccoli, 1/2 cup Frozen yogurt, 1/2 cup Decaffeinated tea, 1 cup Non-dairy creamer, 2 Tbsp Margarine, 1 tsp Salt, 1/4 tsp Sugar, 1 tsp    LEVEL 5:  REGULAR FOODS  -Foods in this group are soft, moist, regularly textured foods.   -This level includes meat and breads, which tend to be the hardest things to swallow.   -Eat very slowly, chew well and continue to avoid carbonated drinks. -most people are at this level in 6 weeks  Hot Foods Cold Foods  Baked fish or skinned Soft cheeses - cottage cheese  Souffles and omelets Cream cheese  Eggs Yogurt  Stuffed shells Milk  Spaghetti with meat sauce Milkshakes  Cooked cereal Cold dry cereals (no nuts, dried fruit, coconut)  French toast or pancakes Crackers  Buttered toast Fruit juices or nectars  Noodles or pasta (no rice) Canned fruit  Potatoes (all types) Ripe bananas  Soft, cooked vegetables (no corn, lima, or baked beans) Peeled, ripe, fresh fruit  Creamed soups or vegetable soup Cakes  (no nuts, dried fruit, coconut)  Canned chicken noodle soup Plain doughnuts  Gravies Ice cream  Bacon dressing Pudding, mousse, custard  Sauces:  Cheese, creamed, barbecue, tomato, white Fruit ice, Italian ice, sherbet  Decaffeinated tea or coffee Whipped gelatin  Pork chops Regular gelatin   Canned fruited gelatin molds   Sugar, syrup, honey, jam, jelly   Cream   Non-dairy   Margarine   Oil   Mayonnaise   Ketchup   Mustard   TROUBLESHOOTING IRREGULAR BOWELS    1) Avoid extremes of bowel movements (no bad constipation/diarrhea)  2) Miralax 17gm mixed in 8oz. water or juice-daily. May use BID as needed.  3) Gas-x,Phazyme, etc. as needed for gas & bloating.  4) Soft,bland diet. No spicy,greasy,fried foods.  5) Prilosec over-the-counter as needed  6) May hold gluten/wheat products from diet to see if symptoms improve.  7) May try probiotics (Align, Activa, etc) to help calm the bowels down  7) If symptoms become worse call back immediately.    If you have any questions please call our office at [facility]: [phone].  

## 2023-01-09 NOTE — Discharge Summary (Signed)
Physician Discharge Summary  Sierra Perkins RUE:[id number] DOB: 06-20-1966 DOA: 01/06/2023  PCP: Lewis Moccasin, MD  Admit date: 01/06/2023 Discharge date: 01/09/2023  Recommendations for Outpatient Follow-up:     Follow-up Information     Gaynelle Adu, MD. Go on 02/04/2023.   Specialty: General Surgery Why: arrive at 10am for 10:15 AM appt, For wound re-check Contact information: [address] [place] [address] [place] [address] [phone]                Discharge Diagnoses:  Paraesophageal hernia s/p repair Hypothyroid  Jehovah's witness status  Surgical Procedure:   01/06/2023   11:58 AM   PATIENT:  Sierra Perkins  57 y.o. female   PRE-OPERATIVE DIAGNOSIS:  LARGE PARAESOPHAGEAL HERNIA   POST-OPERATIVE DIAGNOSIS:  LARGE PARAESOPHAGEAL HERNIA   PROCEDURE:  Procedure(s): XI ROBOTIC ASSISTED PARAESOPHAGEAL HIATAL HERNIA REPAIR WITH partial WRAP UPPER GI ENDOSCOPY LAPAROSCOPIC TAP BLOCK   01/07/23 LAPAROSCOPIC DIAGNOSTIC, UPPER ENDOSCOPY, LAPAROSCOPIC GASTROPEXY, LAP GTUBE PLACEMENT, laparoscopic repair of RECURRENT PARAESOPHAGEAL hiatal hernia   Discharge Condition: good Disposition: home  Diet recommendation: full liquid diet  Filed Weights   01/06/23 0600 01/07/23 1325  Weight: 63.9 kg 63.9 kg    History of present illness: pt came in for planned robotic repair of PEH   Hospital Course:  Pt Robotic assisted repair of paraesophageal hernia with partial fundoplication and upper endoscopy on April 30.  Patient was started on clear liquids on postoperative day 0 which she tolerated.  On postoperative day 1 she did having some difficulty tolerating liquids.  A planned upper GI on postoperative day 1 revealed no evidence of a leak but unfortunately significant recurrent hiatal hernia.  A CT scan was performed which also demonstrated no evidence of leak.  She was taken back to the operating room where she underwent diagnostic laparoscopy,  laparoscopic reduction of the recurrent hiatal hernia, I placed 2 additional sutures in the diaphragmatic crura, we also did a laparoscopic gastropexy and placed a gastrotomy tube with laparoscopic assistance.  She was taken back to the floor after the recovery room.  She was started on ice chips that evening.  On the next hospital day she underwent a repeat upper GI which demonstrated no evidence of recurrent hernia she had some expected edema at her diaphragmatic closure and at the level of the wrap.  She was advanced to liquids.  She did have pain in the left upper quadrant thought to be due to the transfascial sutures anchoring the stomach to the abdominal wall.  Her pain was controlled with muscle relaxant, Toradol, opioids.  On May 3 her diet was advanced to full liquids which she tolerated.  She received G-tube teaching.  Her gastrostomy tube was mainly serving as an anchor fixation and not being used for nutrition.  Her vital signs were stable.  She was ambulating.  She received discharge instructions.  She will go out on a full liquid diet.  She was given diet advancement guidelines.  She was also educated on her G-tube. She was having flatus. No burping. No dysphagia.   BP (!) 135/90 (BP Location: Right Arm)   Pulse 74   Temp 98.4 F (36.9 C) (Oral)   Resp 16   Ht 5\' 4"  (1.626 m)   Wt 63.9 kg   LMP 09/02/2016   SpO2 93%   BMI 24.18 kg/m   Gen: alert, NAD, non-toxic appearing Pupils: equal, no scleral icterus symmetric chest rise CV: regular rate and rhythm Abd: soft, mild  approp tender, min distension. G tube intact/secure.   No cellulitis. No incisional hernia Ext: no edema, no calf tenderness Skin: no rash, no jaundice    Discharge Instructions  Discharge Instructions     Call MD for:   Complete by: As directed    Temperature >101   Call MD for:  hives   Complete by: As directed    Call MD for:  persistant dizziness or light-headedness   Complete by: As directed     Call MD for:  persistant nausea and vomiting   Complete by: As directed    Call MD for:  redness, tenderness, or signs of infection (pain, swelling, redness, odor or green/yellow discharge around incision site)   Complete by: As directed    Call MD for:  severe uncontrolled pain   Complete by: As directed    Diet full liquid   Complete by: As directed    Discharge instructions   Complete by: As directed    See [facility] discharge instructions   Increase activity slowly   Complete by: As directed       Allergies as of 01/09/2023       Reactions   Citric Acid Rash        Medication List     STOP taking these medications    HYDROcodone-homatropine 5-1.5 MG/5ML syrup Commonly known as: HYCODAN       TAKE these medications    acetaminophen 500 MG tablet Commonly known as: TYLENOL Take 2 tablets (1,000 mg total) by mouth every 8 (eight) hours for 5 days.   Aleve 220 MG tablet Generic drug: naproxen sodium Take 220 mg by mouth 2 (two) times daily as needed (pain).   fenofibrate 160 MG tablet Take 160 mg by mouth daily.   methocarbamol 750 MG tablet Commonly known as: Robaxin-750 Take 1 tablet (750 mg total) by mouth every 8 (eight) hours as needed for muscle spasms.   omeprazole 40 MG capsule Commonly known as: PRILOSEC Take 1 capsule (40 mg total) by mouth daily. What changed:  when to take this reasons to take this   ondansetron 4 MG tablet Commonly known as: ZOFRAN Take 1 tablet (4 mg total) by mouth every 6 (six) hours as needed for nausea or vomiting. What changed:  when to take this reasons to take this   oxyCODONE 5 MG immediate release tablet Commonly known as: Oxy IR/ROXICODONE Take 1 tablet (5 mg total) by mouth every 6 (six) hours as needed for severe pain.   Synthroid 150 MCG tablet Generic drug: levothyroxine Take 150 mcg by mouth daily before breakfast.   Vitamin D (Ergocalciferol) 1.25 MG (50000 UNIT) Caps capsule Commonly known as:  DRISDOL Take 50,000 Units by mouth every 14 (fourteen) days.        Follow-up Information     Gaynelle Adu, MD. Go on 02/04/2023.   Specialty: General Surgery Why: arrive at 10am for 10:15 AM appt, For wound re-check Contact information: [address] [place] [address] [phone]                  The results of significant diagnostics from this hospitalization (including imaging, microbiology, ancillary and laboratory) are listed below for reference.    Significant Diagnostic Studies: DG UGI W SINGLE CM (SOL OR THIN BA)  Result Date: 01/08/2023 CLINICAL DATA:  1 day postop from repair of paraesophageal hiatal hernia, with gastropexy and gastrostomy tube placement. Evaluate for postop leak or obstruction. EXAM: UPPER  GI SERIES WITH KUB TECHNIQUE: After obtaining a scout radiograph a routine upper GI series was performed using diluted Omnipaque 300 water-soluble contrast. FLUOROSCOPY: Radiation Exposure Index (as provided by the fluoroscopic device): 30.8 mGy Kerma COMPARISON:  01/07/2023 FINDINGS: The scout radiograph shows percutaneous gastrostomy tube in the left abdomen. Gaseous distention of the colon is consistent with postop ileus. Small amount of residual contrast material is seen within the ascending colon as well as left-sided colonic diverticula. Previously seen hiatal hernia is no longer visualized. Severe narrowing is seen at the GE junction, likely due to fundoplication wrap as well as postop edema. This results in retention of contrast within the esophagus and delayed passage of contrast with jet effect into the stomach. No evidence of contrast leak or extravasation. Gaseous distention of the stomach is noted, as well as stasis of contrast in the proximal and mid esophagus. IMPRESSION: No evidence of persistent hiatal hernia, or postop leak. Severe narrowing at the GE junction, likely due to fundoplication wrap as well as postop edema. This results in  delayed passage of contrast into the stomach with "jet effect". Postop colonic ileus. Electronically Signed   By: Danae Orleans M.D.   On: 01/08/2023 08:59   CT CHEST ABDOMEN PELVIS W CONTRAST  Result Date: 01/07/2023 CLINICAL DATA:  POD 1 sp repair paraesophageal hernia; recurrence on Upper GI EXAM: CT CHEST, ABDOMEN, AND PELVIS WITH CONTRAST TECHNIQUE: Multidetector CT imaging of the chest, abdomen and pelvis was performed following the standard protocol during bolus administration of intravenous contrast. RADIATION DOSE REDUCTION: This exam was performed according to the departmental dose-optimization program which includes automated exposure control, adjustment of the mA and/or kV according to patient size and/or use of iterative reconstruction technique. CONTRAST:  OMNIPAQUE IOHEXOL 300 MG/ML  SOLN COMPARISON:  Upper GI, 01/07/2023 and 11/24/2022. CT AP, 10/08/2022 and 01/28/2013. FINDINGS: CT CHEST FINDINGS Cardiovascular: No significant vascular findings. No thoracic aortic aneurysm or dissection. Normal heart size. No pericardial effusion. No central or larger pulmonary embolus Mediastinum/Nodes: Small volume of pneumomediastinum. No no enlarged mediastinal, hilar, or axillary lymph nodes. Thyroid gland, trachea, and esophagus demonstrate no significant findings. Lungs/Pleura: Minimal BILATERAL dependent and paraesophageal atelectasis. Lungs are otherwise clear without additional consolidation, mass or suspicious pulmonary nodule. Trace BILATERAL pleural effusions. No pneumothorax. Musculoskeletal: Small volume subcutaneous emphysema at the thoracic inlet and imaged portions of lower neck. No acute chest wall mass or suspicious bone lesion. CT ABDOMEN PELVIS FINDINGS Hepatobiliary: No focal liver abnormality. Mild relative hypodensity of liver, likely transient hepatic attenuation. No gallstones, gallbladder wall thickening, or biliary dilatation. Pancreas: No pancreatic ductal dilatation or  surrounding inflammatory changes. Spleen: Normal in size without focal abnormality. Adrenals/Urinary Tract: Adrenal glands are unremarkable. Chronic LEFT inferior renal pole atrophy. Kidneys are otherwise normal, without renal calculi, focal lesion, or hydronephrosis. Bladder is nondistended. Stomach/Bowel: Patulous distal esophagus. Recurrence of large hiatus hernia, with prominent intrathoracic component. No discrete volvulus or evidence of vascular compromise to stomach. Appendix is normal.  Moderate burden of colonic diverticulosis. Enteral opacification of ingested contrast. No extraluminal extravasation. No evidence of bowel wall thickening, distention, or inflammatory changes. Vascular/Lymphatic: No significant vascular findings are present. No enlarged abdominal or pelvic lymph nodes. Reproductive: Uterus and adnexa are unremarkable. Other: Small volume pneumoperitoneum. Small fat-containing umbilical hernia. No abdominopelvic ascites, focal drainable collection or abscess. Musculoskeletal: Strategically scattered, small volume subcutaneous gas consistent with surgical port sites. No acute or significant osseous findings. IMPRESSION: 1. Gastroesophageal hernia repair, with large hiatal hernia recurrence.  No discrete volvulus or evidence of vascular compromise to the stomach. 2. No CT evidence of extraluminal leakage of ingested contrast. 3. Postsurgical changes, including trace pneumomediastinum, pneumoperitoneum and subcutaneous emphysema at the neck. No focal drainable collection or abscess. These results were called by telephone at the time of interpretation on 01/07/2023 to provider Rakayla Ricklefs , who verbally acknowledged these results. Roanna Banning, MD [facility] Radiology Electronically Signed   By: Roanna Banning M.D.   On: 01/07/2023 12:42   DG UGI W SINGLE CM (SOL OR THIN BA)  Result Date: 01/07/2023 CLINICAL DATA:  282018 S/P repair of paraesophageal  hernia 282018 EXAM: UPPER GI SERIES WITH KUB TECHNIQUE: After obtaining a scout radiograph a routine upper GI series was performed using water-soluble contrast (Omnipaque) FLUOROSCOPY: Radiation Exposure Index (as provided by the fluoroscopic device): 24.0 mGy Kerma COMPARISON:  Upper GI exam 11/24/2022, CT abdomen pelvis 10/08/2022 FINDINGS: Scout radiograph demonstrates diffuse gaseous distention of the stomach, small, and large bowel compatible with postoperative ileus. Due to patient condition, a limited upper GI exam was then performed. The patient was only able to tolerate swallowing 20 mL of water-soluble contrast, after which she had nausea and emesis. There is a recurrent hiatal hernia with a large portion of gas distended and rotated stomach in the chest. There is no evidence of extraluminal contrast leak on this limited exam. Contrast passes through into the infradiaphragmatic portion suggesting there is no obstruction. IMPRESSION: Recurrent hiatal hernia with large portion of gas distended and rotated stomach in the chest. Contrast passes into the infradiaphragmatic portion suggesting this is nonobstructive. No extraluminal contrast leak identified, though note this was a limited exam and the patient was only able to tolerate 20 mL of contrast before vomiting. Diffuse gaseous distension of the stomach, small bowel, and colon suggesting postoperative ileus. These results were called by telephone at the time of interpretation on 01/07/2023 at 8:59 am to provider Tiffony Kite , who verbally acknowledged these results. Electronically Signed   By: Caprice Renshaw M.D.   On: 01/07/2023 09:04    Microbiology: No results found for this or any previous visit (from the past 240 hour(s)).   Labs: Basic Metabolic Panel: Recent Labs  Lab 01/07/23 0455 01/08/23 0505  NA 139 136  K 3.9 4.0  CL 104 101  CO2 28 27  GLUCOSE 193* 186*  BUN 8 9  CREATININE 0.77 0.73  CALCIUM 8.7* 8.2*   Liver Function  Tests: No results for input(s): "AST", "ALT", "ALKPHOS", "BILITOT", "PROT", "ALBUMIN" in the last 168 hours. No results for input(s): "LIPASE", "AMYLASE" in the last 168 hours. No results for input(s): "AMMONIA" in the last 168 hours. CBC: Recent Labs  Lab 01/06/23 1145 01/07/23 0455 01/08/23 0505  WBC 9.4 10.0 9.0  HGB 13.7 13.2 12.8  HCT 42.8 40.1 39.4  MCV 90.5 90.1 90.4  PLT 225 226 197   Cardiac Enzymes: No results for input(s): "CKTOTAL", "CKMB", "CKMBINDEX", "TROPONINI" in the last 168 hours. BNP: BNP (last 3 results) No results for input(s): "BNP" in the last 8760 hours.  ProBNP (last 3 results) No results for input(s): "PROBNP" in the last 8760 hours.  CBG: Recent Labs  Lab 01/08/23 1143 01/08/23 1616 01/08/23 2155 01/09/23 0736 01/09/23 1142  GLUCAP 128* 217* 171* 119* 213*    Principal Problem:   S/P repair of paraesophageal hernia   Time coordinating discharge: 25 min  Signed:  Atilano Ina, MD [facility], [place]  [phone] 01/09/2023, 5:27 PM

## 2023-02-18 DIAGNOSIS — L821 Other seborrheic keratosis: Secondary | ICD-10-CM | POA: Diagnosis not present

## 2023-02-18 DIAGNOSIS — L814 Other melanin hyperpigmentation: Secondary | ICD-10-CM | POA: Diagnosis not present

## 2023-02-18 DIAGNOSIS — D2262 Melanocytic nevi of left upper limb, including shoulder: Secondary | ICD-10-CM | POA: Diagnosis not present

## 2023-02-18 DIAGNOSIS — L280 Lichen simplex chronicus: Secondary | ICD-10-CM | POA: Diagnosis not present

## 2023-03-26 NOTE — Op Note (Signed)
Preoperative diagnosis: LAPAROSCOPIC DIAGNOSTIC, UPPER ENDOSCOPY, LAPAROSCOPIC GASTROPEXY, LAP GTUBE PLACEMENT, laparoscopic repair of RECURRENT PARAESOPHAGEAL hiatal hernia   Postoperative diagnosis: Same   Procedure: Upper endoscopy   Surgeon: Berna Bue, M.D.  Anesthesia: Gen.   Description of procedure: The endoscope was placed in the mouth and oropharynx and under endoscopic vision it was advanced to the esophagogastric junction. There is no evidence of esophageal injury on upper endoscopy. There is no evidence of bubbles or leak from Dr. Tawana Scale viewpoint laparoscopically. The scope was advanced into the gastric body and retroflexed and the wrap appeared intact with a Hill grade 1 valve view.   The lumen was decompressed and the scope was withdrawn without difficulty.    Berna Bue, M.D. General, Bariatric, & Minimally Invasive Surgery [facility] [place]

## 2023-03-30 DIAGNOSIS — Z1231 Encounter for screening mammogram for malignant neoplasm of breast: Secondary | ICD-10-CM | POA: Diagnosis not present

## 2023-04-09 DIAGNOSIS — Z9889 Other specified postprocedural states: Secondary | ICD-10-CM | POA: Diagnosis not present

## 2023-04-09 DIAGNOSIS — Z8719 Personal history of other diseases of the digestive system: Secondary | ICD-10-CM | POA: Diagnosis not present

## 2023-05-14 ENCOUNTER — Ambulatory Visit: Payer: Self-pay | Admitting: Podiatry

## 2023-06-30 DIAGNOSIS — L820 Inflamed seborrheic keratosis: Secondary | ICD-10-CM | POA: Diagnosis not present

## 2023-07-23 DIAGNOSIS — M65941 Unspecified synovitis and tenosynovitis, right hand: Secondary | ICD-10-CM | POA: Diagnosis not present

## 2023-07-23 DIAGNOSIS — M79641 Pain in right hand: Secondary | ICD-10-CM | POA: Diagnosis not present

## 2023-09-18 DIAGNOSIS — F4322 Adjustment disorder with anxiety: Secondary | ICD-10-CM | POA: Diagnosis not present

## 2023-10-08 DIAGNOSIS — F4322 Adjustment disorder with anxiety: Secondary | ICD-10-CM | POA: Diagnosis not present

## 2023-10-28 DIAGNOSIS — E559 Vitamin D deficiency, unspecified: Secondary | ICD-10-CM | POA: Diagnosis not present

## 2023-10-28 DIAGNOSIS — C73 Malignant neoplasm of thyroid gland: Secondary | ICD-10-CM | POA: Diagnosis not present

## 2023-10-28 DIAGNOSIS — E890 Postprocedural hypothyroidism: Secondary | ICD-10-CM | POA: Diagnosis not present

## 2023-10-28 DIAGNOSIS — E119 Type 2 diabetes mellitus without complications: Secondary | ICD-10-CM | POA: Diagnosis not present

## 2023-12-30 DIAGNOSIS — G8929 Other chronic pain: Secondary | ICD-10-CM | POA: Diagnosis not present

## 2023-12-30 DIAGNOSIS — M25562 Pain in left knee: Secondary | ICD-10-CM | POA: Diagnosis not present

## 2024-03-28 DIAGNOSIS — D225 Melanocytic nevi of trunk: Secondary | ICD-10-CM | POA: Diagnosis not present

## 2024-03-28 DIAGNOSIS — D2261 Melanocytic nevi of right upper limb, including shoulder: Secondary | ICD-10-CM | POA: Diagnosis not present

## 2024-03-28 DIAGNOSIS — D2262 Melanocytic nevi of left upper limb, including shoulder: Secondary | ICD-10-CM | POA: Diagnosis not present

## 2024-03-28 DIAGNOSIS — L570 Actinic keratosis: Secondary | ICD-10-CM | POA: Diagnosis not present

## 2024-03-29 DIAGNOSIS — E559 Vitamin D deficiency, unspecified: Secondary | ICD-10-CM | POA: Diagnosis not present

## 2024-03-29 DIAGNOSIS — E119 Type 2 diabetes mellitus without complications: Secondary | ICD-10-CM | POA: Diagnosis not present

## 2024-03-29 DIAGNOSIS — E890 Postprocedural hypothyroidism: Secondary | ICD-10-CM | POA: Diagnosis not present

## 2024-03-29 DIAGNOSIS — C73 Malignant neoplasm of thyroid gland: Secondary | ICD-10-CM | POA: Diagnosis not present

## 2024-04-11 ENCOUNTER — Ambulatory Visit (INDEPENDENT_AMBULATORY_CARE_PROVIDER_SITE_OTHER)

## 2024-04-11 ENCOUNTER — Encounter: Payer: Self-pay | Admitting: Podiatry

## 2024-04-11 ENCOUNTER — Ambulatory Visit (INDEPENDENT_AMBULATORY_CARE_PROVIDER_SITE_OTHER): Admitting: Podiatry

## 2024-04-11 VITALS — Ht 64.0 in | Wt 140.9 lb

## 2024-04-11 DIAGNOSIS — M7752 Other enthesopathy of left foot: Secondary | ICD-10-CM | POA: Diagnosis not present

## 2024-04-11 DIAGNOSIS — M65972 Unspecified synovitis and tenosynovitis, left ankle and foot: Secondary | ICD-10-CM

## 2024-04-11 MED ORDER — METHYLPREDNISOLONE 4 MG PO TBPK: ORAL_TABLET | ORAL | 0 refills | Status: AC

## 2024-04-11 NOTE — Progress Notes (Unsigned)
   Chief Complaint  Patient presents with   Foot Pain    Pt is here due to left foot pain that started this morning, no injury to the foot, states she went walking and did yard work yesterday, has taking OTC meds with no relief.    HPI: 58 y.o. female presenting today as a new patient for above complaint  Past Medical History:  Diagnosis Date   Degenerative disk disease    C4-C7   Diverticulosis    Non-functioning kidney    left   PONV (postoperative nausea and vomiting)    Pre-diabetes    Thyroid  cancer [facility])    February 2014    Past Surgical History:  Procedure Laterality Date   CESAREAN SECTION     KNEE ARTHROSCOPY Bilateral    LAMINECTOMY     LAPAROSCOPIC NISSEN FUNDOPLICATION N/A 01/07/2023   Procedure: LAPAROSCOPIC DIAGNOSTIC, UPPER ENDOSCOPY, GASTROPEXY, LAP GTUBE PLACEMENT, laparoscopic repair of hiatal hernia;  Surgeon: Tanda Locus, MD;  Location: [facility];  Service: General;  Laterality: N/A;   TOTAL THYROIDECTOMY  10/20/2012   DR. BENNETT   UPPER GI ENDOSCOPY N/A 01/06/2023   Procedure: UPPER GI ENDOSCOPY;  Surgeon: Tanda Locus, MD;  Location: [facility];  Service: General;  Laterality: N/A;   XI ROBOTIC ASSISTED PARAESOPHAGEAL HERNIA REPAIR N/A 01/06/2023   Procedure: XI ROBOTIC ASSISTED PARAESOPHAGEAL HIATAL HERNIA REPAIR WITH partial WRAP;  Surgeon: Tanda Locus, MD;  Location: [facility];  Service: General;  Laterality: N/A;    Allergies  Allergen Reactions   Citric Acid Rash     Physical Exam: General: The patient is alert and oriented x3 in no acute distress.  Dermatology: Skin is warm, dry and supple bilateral lower extremities.   Vascular: Palpable pedal pulses bilaterally. Capillary refill within normal limits.  No appreciable edema.  No erythema.  Neurological: Grossly intact via light touch  Musculoskeletal Exam: Tenderness with palpation noted throughout the midfoot left  Radiographic Exam LT foot 04/11/2024:  Normal osseous mineralization. Joint spaces  preserved.  No fractures or osseous irregularities noted.  Impression: Negative  Assessment/Plan of Care: 1.  Midtarsal joint capsulitis/synovitis left  -Patient evaluated.  X-rays reviewed -Injection of 0.5 cc Celestone  Soluspan injected into the left midtarsal joint -Prescription for Medrol  Dosepak -Recommend good supportive tennis shoes and sneakers.  Refrain from going barefoot -Return to clinic PRN  *daughter's wedding in [place] 04/30/24     Thresa EMERSON Sar, DPM [facility]  Dr. Thresa EMERSON Sar, DPM    [address] [place], [place] [address]                Office [phone]  Fax [phone]

## 2024-04-12 DIAGNOSIS — M65972 Unspecified synovitis and tenosynovitis, left ankle and foot: Secondary | ICD-10-CM | POA: Diagnosis not present

## 2024-04-12 MED ORDER — BETAMETHASONE SOD PHOS & ACET 6 (3-3) MG/ML IJ SUSP
3.0000 mg | Freq: Once | INTRAMUSCULAR | Status: AC
Start: 2024-04-12 — End: 2024-04-12
  Administered 2024-04-12: 3 mg via INTRA_ARTICULAR

## 2024-04-27 ENCOUNTER — Ambulatory Visit: Admitting: Podiatry

## 2024-06-09 ENCOUNTER — Encounter: Payer: Self-pay | Admitting: *Deleted

## 2024-06-09 NOTE — Progress Notes (Signed)
 Sierra Perkins                                          MRN: [id number]   06/09/2024   The [facility] Quality Team Specialist reviewed this patient medical record for the purposes of chart review for care gap closure. The following were reviewed: chart review for care gap closure-kidney health evaluation for diabetes:eGFR  and uACR.    [facility] Quality Team
